# Patient Record
Sex: Male | Born: 1960
Health system: Southern US, Community
[De-identification: ages and names within clinical notes are randomized; demographics above are authoritative.]

## PROBLEM LIST (undated history)

## (undated) DIAGNOSIS — M1990 Unspecified osteoarthritis, unspecified site: Secondary | ICD-10-CM

## (undated) DIAGNOSIS — E119 Type 2 diabetes mellitus without complications: Secondary | ICD-10-CM

## (undated) DIAGNOSIS — I10 Essential (primary) hypertension: Secondary | ICD-10-CM

## (undated) DIAGNOSIS — Z87442 Personal history of urinary calculi: Secondary | ICD-10-CM

## (undated) HISTORY — PX: KNEE ARTHROSCOPY: SUR90

## (undated) HISTORY — PX: APPENDECTOMY: SHX54

## (undated) HISTORY — PX: BACK SURGERY: SHX140

## (undated) HISTORY — PX: FINGER SURGERY: SHX640

---

## 2008-05-28 ENCOUNTER — Emergency Department (HOSPITAL_COMMUNITY): Admission: EM | Admit: 2008-05-28 | Discharge: 2008-05-28 | Payer: Self-pay | Admitting: Emergency Medicine

## 2008-05-29 ENCOUNTER — Ambulatory Visit: Payer: Self-pay | Admitting: Orthopedic Surgery

## 2008-05-29 DIAGNOSIS — S61409A Unspecified open wound of unspecified hand, initial encounter: Secondary | ICD-10-CM | POA: Insufficient documentation

## 2008-05-29 DIAGNOSIS — S61209A Unspecified open wound of unspecified finger without damage to nail, initial encounter: Secondary | ICD-10-CM | POA: Insufficient documentation

## 2008-06-02 ENCOUNTER — Ambulatory Visit: Payer: Self-pay | Admitting: Orthopedic Surgery

## 2008-06-04 ENCOUNTER — Encounter: Payer: Self-pay | Admitting: Orthopedic Surgery

## 2008-11-10 ENCOUNTER — Ambulatory Visit (HOSPITAL_COMMUNITY): Admission: RE | Admit: 2008-11-10 | Discharge: 2008-11-10 | Payer: Self-pay | Admitting: Family Medicine

## 2009-07-15 ENCOUNTER — Emergency Department (HOSPITAL_COMMUNITY): Admission: EM | Admit: 2009-07-15 | Discharge: 2009-07-15 | Payer: Self-pay | Admitting: Emergency Medicine

## 2009-10-22 ENCOUNTER — Ambulatory Visit (HOSPITAL_COMMUNITY): Admission: RE | Admit: 2009-10-22 | Discharge: 2009-10-23 | Payer: Self-pay | Admitting: Neurological Surgery

## 2010-03-19 ENCOUNTER — Other Ambulatory Visit (HOSPITAL_COMMUNITY): Payer: Self-pay | Admitting: Family Medicine

## 2010-03-24 ENCOUNTER — Ambulatory Visit (HOSPITAL_COMMUNITY)
Admission: RE | Admit: 2010-03-24 | Discharge: 2010-03-24 | Disposition: A | Discharge status: Home / Self Care | Payer: PRIVATE HEALTH INSURANCE | Source: Ambulatory Visit | Attending: Family Medicine | Admitting: Family Medicine

## 2010-03-24 DIAGNOSIS — N1330 Unspecified hydronephrosis: Secondary | ICD-10-CM | POA: Insufficient documentation

## 2010-03-24 DIAGNOSIS — R319 Hematuria, unspecified: Secondary | ICD-10-CM | POA: Insufficient documentation

## 2010-04-29 ENCOUNTER — Ambulatory Visit (HOSPITAL_COMMUNITY)
Admission: RE | Admit: 2010-04-29 | Discharge: 2010-04-29 | Disposition: A | Discharge status: Home / Self Care | Payer: PRIVATE HEALTH INSURANCE | Source: Ambulatory Visit | Attending: Urology | Admitting: Urology

## 2010-04-29 DIAGNOSIS — E119 Type 2 diabetes mellitus without complications: Secondary | ICD-10-CM | POA: Insufficient documentation

## 2010-04-29 DIAGNOSIS — N201 Calculus of ureter: Secondary | ICD-10-CM | POA: Insufficient documentation

## 2010-04-29 LAB — GLUCOSE, CAPILLARY

## 2010-05-07 LAB — SURGICAL PCR SCREEN
MRSA, PCR: NEGATIVE
Staphylococcus aureus: NEGATIVE

## 2010-05-07 LAB — PROTIME-INR: Prothrombin Time: 12 seconds (ref 11.6–15.2)

## 2010-05-07 LAB — CBC
HCT: 44.9 % (ref 39.0–52.0)
MCH: 30.6 pg (ref 26.0–34.0)
MCHC: 34.5 g/dL (ref 30.0–36.0)
RBC: 5.07 MIL/uL (ref 4.22–5.81)

## 2010-05-07 LAB — DIFFERENTIAL
Eosinophils Absolute: 0.4 10*3/uL (ref 0.0–0.7)
Eosinophils Relative: 4 % (ref 0–5)
Lymphocytes Relative: 25 % (ref 12–46)
Lymphs Abs: 2.3 10*3/uL (ref 0.7–4.0)
Monocytes Absolute: 0.8 10*3/uL (ref 0.1–1.0)
Monocytes Relative: 9 % (ref 3–12)

## 2010-05-07 LAB — BASIC METABOLIC PANEL
BUN: 12 mg/dL (ref 6–23)
Calcium: 9.3 mg/dL (ref 8.4–10.5)
Glucose, Bld: 172 mg/dL — ABNORMAL HIGH (ref 70–99)
Sodium: 136 mEq/L (ref 135–145)

## 2010-05-07 LAB — APTT: aPTT: 32 seconds (ref 24–37)

## 2010-05-10 LAB — URINALYSIS, ROUTINE W REFLEX MICROSCOPIC
Bilirubin Urine: NEGATIVE
Glucose, UA: NEGATIVE mg/dL
Ketones, ur: NEGATIVE mg/dL
Leukocytes, UA: NEGATIVE
Protein, ur: NEGATIVE mg/dL
Urobilinogen, UA: 0.2 mg/dL (ref 0.0–1.0)
pH: 7 (ref 5.0–8.0)

## 2010-05-10 LAB — URINE MICROSCOPIC-ADD ON

## 2011-09-02 ENCOUNTER — Other Ambulatory Visit (HOSPITAL_COMMUNITY): Payer: Self-pay | Admitting: Family Medicine

## 2011-09-02 ENCOUNTER — Ambulatory Visit (HOSPITAL_COMMUNITY)
Admission: RE | Admit: 2011-09-02 | Discharge: 2011-09-02 | Disposition: A | Discharge status: Home / Self Care | Payer: PRIVATE HEALTH INSURANCE | Source: Ambulatory Visit | Attending: Family Medicine | Admitting: Family Medicine

## 2011-09-02 DIAGNOSIS — M25562 Pain in left knee: Secondary | ICD-10-CM

## 2011-09-02 DIAGNOSIS — M25569 Pain in unspecified knee: Secondary | ICD-10-CM | POA: Insufficient documentation

## 2011-09-22 ENCOUNTER — Ambulatory Visit: Payer: PRIVATE HEALTH INSURANCE | Admitting: Orthopedic Surgery

## 2011-09-26 ENCOUNTER — Other Ambulatory Visit: Payer: Self-pay | Admitting: Specialist

## 2011-09-26 ENCOUNTER — Telehealth: Payer: Self-pay | Admitting: Orthopedic Surgery

## 2011-09-26 DIAGNOSIS — M1710 Unilateral primary osteoarthritis, unspecified knee: Secondary | ICD-10-CM

## 2011-09-26 DIAGNOSIS — IMO0002 Reserved for concepts with insufficient information to code with codable children: Secondary | ICD-10-CM

## 2011-09-26 NOTE — Telephone Encounter (Signed)
Patient had been given first available appointment following Dr. Mort Sawyers return to office*09/22/11.   Called pt on 09/21/11 - relayed Dr Romeo Apple had emergency and was unable to return for this 09/22/11 appointment.  We advised we were awaiting further direction from Dr. Romeo Apple and would call back to re-schedule as soon as possible..  09/23/11 called pt to offer new appt, lft msg on mach, offered for Tuesday 09/27/11.  09/26/11 Called back to patient to offer appointment for tomorrow (09/27/11), as no response to call on 09/23/11. Patient states was hurting and has already contacted, and been seen, by ortho at [facility].  He thanked [place] for following up.

## 2011-09-29 ENCOUNTER — Ambulatory Visit (HOSPITAL_COMMUNITY)
Admission: RE | Admit: 2011-09-29 | Discharge: 2011-09-29 | Disposition: A | Discharge status: Home / Self Care | Payer: BC Managed Care – PPO | Source: Ambulatory Visit | Attending: Specialist | Admitting: Specialist

## 2011-09-29 ENCOUNTER — Other Ambulatory Visit (HOSPITAL_COMMUNITY): Payer: Self-pay | Admitting: Specialist

## 2011-09-29 ENCOUNTER — Ambulatory Visit
Admission: RE | Admit: 2011-09-29 | Discharge: 2011-09-29 | Disposition: A | Discharge status: Home / Self Care | Payer: BC Managed Care – PPO | Source: Ambulatory Visit | Attending: Specialist | Admitting: Specialist

## 2011-09-29 DIAGNOSIS — IMO0002 Reserved for concepts with insufficient information to code with codable children: Secondary | ICD-10-CM

## 2011-09-29 DIAGNOSIS — M1710 Unilateral primary osteoarthritis, unspecified knee: Secondary | ICD-10-CM

## 2011-09-29 DIAGNOSIS — Z01818 Encounter for other preprocedural examination: Secondary | ICD-10-CM

## 2011-09-29 DIAGNOSIS — Z0389 Encounter for observation for other suspected diseases and conditions ruled out: Secondary | ICD-10-CM | POA: Insufficient documentation

## 2011-10-28 ENCOUNTER — Ambulatory Visit (HOSPITAL_COMMUNITY)
Admission: RE | Admit: 2011-10-28 | Discharge: 2011-10-28 | Disposition: A | Discharge status: Home / Self Care | Payer: BC Managed Care – PPO | Source: Ambulatory Visit | Attending: Specialist | Admitting: Specialist

## 2011-10-28 DIAGNOSIS — IMO0001 Reserved for inherently not codable concepts without codable children: Secondary | ICD-10-CM | POA: Insufficient documentation

## 2011-10-28 DIAGNOSIS — R262 Difficulty in walking, not elsewhere classified: Secondary | ICD-10-CM | POA: Insufficient documentation

## 2011-10-28 DIAGNOSIS — M25569 Pain in unspecified knee: Secondary | ICD-10-CM | POA: Insufficient documentation

## 2011-10-28 NOTE — Evaluation (Signed)
Physical Therapy Evaluation  Patient Details  Name: Alex Taylor MRN: [id number] Date of Birth: 23-Sep-1960  Today's Date: 10/28/2011 Time:  -     Visit#: 1  of 4   Re-eval: 11/11/11 Assessment Diagnosis: L arthroscopic surgery Surgical Date: 10/20/11 Next MD Visit:  (3 weeks)  Authorization: BCBS   Past Medical History: No past medical history on file. Past Surgical History: No past surgical history on file.  Subjective Symptoms/Limitations Symptoms: Mr. Edenfield states that he had arthroscopic surgery performed on his left knee on 10/20/11.  He has been referred to therapy to progress him to prior function.  Mr. Nelis states that his knee had been bothering him for about 6-8 months prior to the surgery.  Currently the pain is mainly on the anterior aspect and is more soreness than anything. How long can you sit comfortably?: The patient states he can sit for 30-40 minutes at a time. How long can you stand comfortably?: The patient states he is able to stand for an hour How long can you walk comfortably?: The patient has an antalgic gait.  The most he has walked 20-30 minutes without stopping without difficulty. Pain Assessment Currently in Pain?: Yes Pain Score: 0-No pain (mainly soreness) Pain Relieving Factors: icing; elevating Effect of Pain on Daily Activities: Pt has not really been doing much.    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  Sensation/Coordination/Flexibility/Functional Tests Functional Tests Functional Tests: 52/80  Assessment LLE AROM (degrees) Left Knee Extension: 3  Left Knee Flexion: 120  LLE Strength Left Hip Flexion: 5/5 Left Hip Extension: 5/5 Left Hip ABduction: 5/5 Left Hip ADduction: 5/5 Left Knee Flexion: 5/5 Left Knee Extension: 5/5 Left Ankle  Dorsiflexion: 5/5  Exercise/Treatments Mobility/Balance  Ambulation/Gait Ambulation/Gait:  (Pt exhibits and antalgic gait with decrease WB on L LE) Static Standing Balance Single Leg Stance - Right Leg: 24  Single Leg Stance - Left Leg: 30    Stretches Active Hamstring Stretch: 3 reps;30 seconds   Standing Heel Raises: 15 reps Forward Lunges: Left;5 reps Functional Squat: 10 reps     Physical Therapy Assessment and Plan PT Assessment and Plan Clinical Impression Statement: Pt with good mm test and ROM.  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      Goals Home Exercise Program Pt will Perform Home Exercise Program: Independently PT Short Term Goals Time to Complete Short Term Goals: 2 weeks PT Short Term Goal 1: normalized gt PT Short Term Goal 2: Pt to be comfortable squatting down to pick up object PT Short Term Goal 3: pt to be able to go up and down steps without hand rail PT Short Term Goal 4: no pain  Problem List Patient Active Problem List  Diagnosis  . LACERATION, HAND, RIGHT  . OPEN WOUND OF FINGER WITH TENDON INVOLVEMENT  . Difficulty in walking    PT Plan of Care PT Home Exercise Plan: given  Consulted and Agree with Plan of Care: Patient   RUSSELL,CINDY 10/28/2011, 3:52 PM  Physician Documentation Your signature is required to indicate approval of the treatment plan as stated above.  Please sign and either send  electronically or make a copy of this report for your files and return this physician signed original.   Please mark one 1.__approve of plan  2. ___approve of plan with the following conditions.   ______________________________                                                          _____________________ Physician Signature                                                                                                             Date

## 2011-11-02 ENCOUNTER — Ambulatory Visit (HOSPITAL_COMMUNITY)
Admission: RE | Admit: 2011-11-02 | Discharge: 2011-11-02 | Disposition: A | Discharge status: Home / Self Care | Payer: BC Managed Care – PPO | Source: Ambulatory Visit | Attending: Family Medicine | Admitting: Family Medicine

## 2011-11-02 DIAGNOSIS — R262 Difficulty in walking, not elsewhere classified: Secondary | ICD-10-CM

## 2011-11-02 NOTE — Progress Notes (Signed)
Physical Therapy Treatment Patient Details  Name: Alex Taylor MRN: [id number] Date of Birth: [birth year]  Today's Date: 11/02/2011 Time: 0802-0844 PT Time Calculation (min): 42 min  Visit#: 2  of 4   Re-eval: 11/10/11    Authorization:    Authorization Time Period:    Authorization Visit#:   of     Subjective: Symptoms/Limitations Symptoms: Alex Taylor states he hit his knee this weekend and it remains sore.  States that he feels this is the reason that he is limping.    Exercise/Treatments   Stretches Active Hamstring Stretch: 3 reps;30 seconds Passive Hamstring Stretch: 1 rep;60 seconds Gastroc Stretch: 3 reps;30 seconds Aerobic Tread Mill: 1.7 x 6'   Standing Heel Raises: 15 reps Forward Lunges: 10 reps Terminal Knee Extension: 10 reps Lateral Step Up: 10 reps Forward Step Up: 10 reps Step Down: 10 reps Functional Squat: 15 reps Stairs: 1 rt Rocker Board: 1 minute SLS: 3 x 60sec. Rebounder:  (1 min R/L)     Physical Therapy Assessment and Plan PT Assessment and Plan Clinical Impression Statement: Pt exhibited good control with all new exercises today; some fatigue noted with step downs.   Rehab Potential: Good PT Plan: Pt to begin rebounder, gait training on TM if pt still has limp and lifting next treatment.      Goals    Problem List Patient Active Problem List  Diagnosis  . LACERATION, HAND, RIGHT  . OPEN WOUND OF FINGER WITH TENDON INVOLVEMENT  . Difficulty in walking    PT - End of Session Activity Tolerance: Patient tolerated treatment well General Behavior During Session: [facility] for tasks performed Cognition: [facility] for tasks performed PT Plan of Care PT Home Exercise Plan: update given Consulted and Agree with Plan of Care: Patient  GP    AlexCINDY 11/02/2011, 8:46 AM

## 2011-11-03 ENCOUNTER — Inpatient Hospital Stay (HOSPITAL_COMMUNITY)
Admission: RE | Admit: 2011-11-03 | Payer: BC Managed Care – PPO | Source: Ambulatory Visit | Admitting: Physical Therapy

## 2011-11-08 ENCOUNTER — Ambulatory Visit (HOSPITAL_COMMUNITY)
Admission: RE | Admit: 2011-11-08 | Discharge: 2011-11-08 | Disposition: A | Discharge status: Home / Self Care | Payer: BC Managed Care – PPO | Source: Ambulatory Visit | Attending: Family Medicine | Admitting: Family Medicine

## 2011-11-08 DIAGNOSIS — R262 Difficulty in walking, not elsewhere classified: Secondary | ICD-10-CM

## 2011-11-08 NOTE — Progress Notes (Signed)
Physical Therapy Treatment Patient Details  Name: Alex Taylor MRN: [id number] Date of Birth: [birth year]  Today's Date: 11/08/2011 Time: 0800-0846 PT Time Calculation (min): 46 min  Visit#: 3  of 4   Re-eval: 11/10/11  Charge: therex 38 Gait 8   Authorization: BCBS   Subjective: Symptoms/Limitations Symptoms: L knee is a little tender/sore today, intermittent pain.  Objective:   Exercise/Treatments Stretches Active Hamstring Stretch: 3 reps;30 seconds Gastroc Stretch: 3 reps;30 seconds Aerobic Tread Mill: 2.0 x 8' Standing Forward Lunges: 10 reps Terminal Knee Extension: 15 reps;Theraband Theraband Level (Terminal Knee Extension): Level 4 (Blue) Lateral Step Up: 15 reps;Step Height: 6";Hand Hold: 0 Forward Step Up: 15 reps;Hand Hold: 0;Step Height: 6" Step Down: 15 reps;Hand Hold: 0;Step Height: 6" Functional Squat: 15 reps;Limitations Functional Squat Limitations: proper lifting orange ball off 6 in step Rocker Board: 1 minute;Limitations Rocker Board Limitations: R/L Rebounder: static and dynamic surface R/L 15x each    Physical Therapy Assessment and Plan PT Assessment and Plan Clinical Impression Statement: Added rebounder for balance with cueing for spatial awareness on static and dynamic surfaces, pt required min assistance with LOB episodes with new exercise. Pt with good control though most therex, noted increased fatigue wtih step down no HHA. Pt wtih good gait mechanics noted with HHA on TM, attempted with no UE A with antalgic gait. PT Plan: Progress gait mechanics, improve balance and strength.    Goals    Problem List Patient Active Problem List  Diagnosis  . LACERATION, HAND, RIGHT  . OPEN WOUND OF FINGER WITH TENDON INVOLVEMENT  . Difficulty in walking    PT - End of Session Activity Tolerance: Patient tolerated treatment well General Behavior During Session: [facility] for tasks performed Cognition: [facility] for tasks performed  GP    Juel Burrow 11/08/2011, 12:50 PM

## 2011-11-10 ENCOUNTER — Ambulatory Visit (HOSPITAL_COMMUNITY): Payer: BC Managed Care – PPO | Admitting: Physical Therapy

## 2011-11-15 ENCOUNTER — Ambulatory Visit (HOSPITAL_COMMUNITY)
Admission: RE | Admit: 2011-11-15 | Discharge: 2011-11-15 | Disposition: A | Discharge status: Home / Self Care | Payer: BC Managed Care – PPO | Source: Ambulatory Visit | Attending: Family Medicine | Admitting: Family Medicine

## 2011-11-15 NOTE — Progress Notes (Signed)
Physical Therapy Treatment Patient Details  Name: Alex Taylor MRN: [id number] Date of Birth: 07/25/1960  Today's Date: 11/15/2011 Time: 4696-2952 PT Time Calculation (min): 38 min Charges:  therex 38' Visit#: 4  of 4   Re-eval: 11/17/11 Assessment Diagnosis: L arthroscopic surgery Surgical Date: 10/20/11 Next MD Visit: 11/18/11  ASubjective: Symptoms/Limitations Symptoms: Pt. states he gets twinges of pain every now and then.  Currently 1-2/10 when having twinges. Pain Assessment Currently in Pain?: Yes Pain Score:   2 Pain Location: Knee Pain Orientation: Left   Exercise/Treatments Stretches Gastroc Stretch: 3 reps;30 seconds;Limitations Gastroc Stretch Limitations: slant board Aerobic Tread Mill: 2.0 x 8' Standing Forward Lunges: 15 reps Terminal Knee Extension: 15 reps;Theraband Theraband Level (Terminal Knee Extension): Level 4 (Blue) Lateral Step Up: 15 reps;Step Height: 6";Hand Hold: 0 Forward Step Up: 15 reps;Hand Hold: 0;Step Height: 6" Step Down: 15 reps;Hand Hold: 0;Step Height: 6" Functional Squat: 15 reps;Limitations Functional Squat Limitations: proper lifting orange ball off 6 in step Rocker Board: 2 minutes Rocker Board Limitations: R/L Rebounder: static and dynamic surface R/L 15x each     Physical Therapy Assessment and Plan PT Assessment and Plan Clinical Impression Statement: Pt. continues to require step down to complete rebounder B LE's due to weakness/instability.    Less fatigue/weakness noted with eccentric strength As noted with forward step downs.  Less antalgia with gait. PT Plan: Re-eval next visit.     Problem List Patient Active Problem List  Diagnosis  . LACERATION, HAND, RIGHT  . OPEN WOUND OF FINGER WITH TENDON INVOLVEMENT  . Difficulty in walking    PT - End of Session Activity Tolerance: Patient tolerated treatment well General Behavior During Session: [facility] for tasks performed Cognition: [facility] for tasks performed   Lurena Nida, PTA/CLT 11/15/2011, 9:01 AM

## 2011-11-17 ENCOUNTER — Ambulatory Visit (HOSPITAL_COMMUNITY)
Admission: RE | Admit: 2011-11-17 | Discharge: 2011-11-17 | Disposition: A | Discharge status: Home / Self Care | Payer: BC Managed Care – PPO | Source: Ambulatory Visit | Attending: Family Medicine | Admitting: Family Medicine

## 2011-11-17 NOTE — Evaluation (Addendum)
Physical Therapy Evaluation  Patient Details  Name: Alex Taylor MRN: [id number] Date of Birth: 1960/05/21  Today's Date: 11/17/2011 Time: 3086-5784 PT Time Calculation (min): 28 min  Visit#: 5  of 5   Re-eval: 11/17/11 Charges: Gait x 10' ROMM x 1    Past Medical History: No past medical history on file. Past Surgical History: No past surgical history on file.  Subjective Symptoms/Limitations Symptoms: Pt states that his pain comes and goes. He is not currently having any pain. Pain Assessment Currently in Pain?: No/denies   Sensation/Coordination/Flexibility/Functional Tests Functional Tests Functional Tests: 72//80 (was 52/70)  Assessment LLE AROM (degrees) Left Knee Extension: 0  (was 3) Left Knee Flexion: 125  (was 120) LLE Strength Left Hip Flexion: 5/5 Left Hip Extension: 5/5 Left Hip ABduction: 5/5 Left Hip ADduction: 5/5 Left Knee Flexion: 5/5 Left Knee Extension: 5/5 Left Ankle Dorsiflexion: 5/5  Exercise/Treatments Mobility/Balance  Static Standing Balance Single Leg Stance - Left Leg: 60  (was 30)  Aerobic Tread Mill: 2.0 x 8' (with cueing for posture) Standing Stairs: 1RT reciprocal no HR  Physical Therapy Assessment and Plan PT Assessment and Plan Clinical Impression Statement: Pt displays improved LLE ROM and stability. Pt's LEFS has improved 20 points since initial eval. Pt has met all goals. Pt reports that he is no longer limited by his knee. Pt states that he is comfortable with D/C to HEP.  PT Plan: Recommend D/C to HEP.    Goals Home Exercise Program Pt will Perform Home Exercise Program: Independently PT Short Term Goals Time to Complete Short Term Goals: 2 weeks PT Short Term Goal 1: normalized gt PT Short Term Goal 1 - Progress: Met PT Short Term Goal 2: Pt to be comfortable squatting down to pick up object PT Short Term Goal 2 - Progress: Met PT Short Term Goal 3: pt to be able to go up and down steps without hand rail PT Short  Term Goal 3 - Progress: Met PT Short Term Goal 4: no pain PT Short Term Goal 4 - Progress: Partly met (Pt states that pain is intermittent. )  Problem List Patient Active Problem List  Diagnosis  . LACERATION, HAND, RIGHT  . OPEN WOUND OF FINGER WITH TENDON INVOLVEMENT  . Difficulty in walking    PT - End of Session Activity Tolerance: Patient tolerated treatment well General Behavior During Session: [facility] for tasks performed Cognition: [facility] for tasks performed  Seth Bake, PTA 11/17/2011, 8:47 AM  Physician Documentation Your signature is required to indicate approval of the treatment plan as stated above.  Please sign and either send electronically or make a copy of this report for your files and return this physician signed original.   Please mark one 1.__approve of plan  2. ___approve of plan with the following conditions.   ______________________________                                                          _____________________ Physician Signature  Date  

## 2011-12-15 ENCOUNTER — Encounter (HOSPITAL_COMMUNITY): Payer: Self-pay

## 2011-12-15 NOTE — H&P (Signed)
  NTS SOAP Note  Vital Signs:  Vitals as of: 12/15/2011: Systolic 166: Diastolic 101: Heart Rate 85: Temp 96.55F: Height 29ft 7in: Weight 251Lbs 0 Ounces: BMI 39  BMI : 39.31 kg/m2  Subjective: This 52 Years 42 Months old Male presents for screening TCS.  Never has had a colonoscopy.  Denies any significant lower gi complaints.  No family h/o colon cancer.   Review of Symptoms:  Constitutional:unremarkable   Head:unremarkable    Eyes:unremarkable   Nose/Mouth/Throat:unremarkable Cardiovascular:  unremarkable   Respiratory:unremarkable   Gastrointestin    heartburn Genitourinary:    frequency Musculoskeletal:unremarkable   Skin:unremarkable Hematolgic/Lymphatic:unremarkable     Allergic/Immunologic:unremarkable     Past Medical History:    Reviewed   Past Medical History  Surgical History: back surgery, knee surgery Medical Problems:  Diabetes, High cholesterol Allergies: pcn, codeine Medications: glucosamine, ibuprofen, lovastatin, metformin, MVI, baby asa   Social History:Reviewed  Social History  Preferred Language: English Ethnicity: Not Hispanic / Latino Age: 87 Years 8 Months Marital Status:  M Alcohol: yes, 12 pack a week Recreational drug(s):  No   Smoking Status: Never smoker reviewed on 12/15/2011 Functional Status reviewed on mm/dd/yyyy ------------------------------------------------ Bathing: Normal Cooking: Normal Dressing: Normal Driving: Normal Eating: Normal Managing Meds: Normal Oral Care: Normal Shopping: Normal Toileting: Normal Transferring: Normal Walking: Normal Cognitive Status reviewed on mm/dd/yyyy ------------------------------------------------ Attention: Normal Decision Making: Normal Language: Normal Memory: Normal Motor: Normal Perception: Normal Problem Solving: Normal Visual and Spatial: Normal   Family History:  Reviewed   Family History  Is there a family history  of:No family h/o colon cancer    Objective Information: General:  Well appearing, well nourished in no distress. Neck:  Supple without lymphadenopathy.  Heart:  RRR, no murmur Lungs:    CTA bilaterally, no wheezes, rhonchi, rales.  Breathing unlabored. Abdomen:Soft, NT/ND, no HSM, no masses.   deferred to procedure  Assessment:Need for screening TCS  Diagnosis &amp; Procedure: DiagnosisCode: V76.51, ProcedureCode: 16109,    Plan:Scheduled for TCS on 12/20/11.   Patient Education:Alternative treatments to surgery were discussed with patient (and family).  Risks and benefits  of procedure were fully explained to the patient (and family) who gave informed consent. Patient/family questions were addressed.  Follow-up:Pending Surgery

## 2011-12-15 NOTE — H&P (Deleted)
  NTS SOAP Note  Vital Signs:  Vitals as of: 12/15/2011: Systolic 152: Diastolic 91: Heart Rate 70: Temp 98.16F: Height 45ft 2in: Weight 130Lbs 0 Ounces: BMI 24  BMI : 23.78 kg/m2  Subjective: This 51 Years 68 Months old Male presents for portacath insertion.  Is about to undergo chemotherapy for metastatic cancer, unknown primary.   Review of Symptoms:  Constitutional:unremarkable   Head:unremarkable    Eyes:unremarkable   Nose/Mouth/Throat:unremarkable Cardiovascular:  unremarkable   Respiratory:unremarkable   Gastrointestinal:  unremarkable   Genitourinary:unremarkable       joint pain Skin:unremarkable Hematolgic/Lymphatic:unremarkable     Allergic/Immunologic:unremarkable     Past Medical History:    Reviewed   Past Medical History  Surgical History: mastectomy right, liver surgery, bladder tackup, hysterectomy Medical Problems: metastatic cancer unknown primary, HTN, DVT Allergies: nkda Medications: coumadin, HCTZ, metoprolol, allopurinol, kcl, lisinopril, fish oil, MVI   Social History:Reviewed  Social History  Preferred Language: English Race:  Black or African American Ethnicity: Not Hispanic / Latino Age: 51 Years 8 Months Marital Status:  M Alcohol:  No Recreational drug(s):  No   Smoking Status: Never smoker reviewed on 12/15/2011 Functional Status reviewed on mm/dd/yyyy ------------------------------------------------ Bathing: Normal Cooking: Normal Dressing: Normal Driving: Normal Eating: Normal Managing Meds: Normal Oral Care: Normal Shopping: Normal Toileting: Normal Transferring: Normal Walking: Normal Cognitive Status reviewed on mm/dd/yyyy ------------------------------------------------ Attention: Normal Decision Making: Normal Language: Normal Memory: Normal Motor: Normal Perception: Normal Problem Solving: Normal Visual and Spatial: Normal   Family History:  Reviewed   Family  History              Mother:  Diabetes Type II, Coronary Artery Disease             Sibling:  Cancer, Diabetes Type II    Objective Information: General:  Well appearing, well nourished in no distress. Heart:  RRR, no murmur Lungs:    CTA bilaterally, no wheezes, rhonchi, rales.  Breathing unlabored.  Assessment:Need for central venous access for chemotherapy  Diagnosis &amp; Procedure: DiagnosisCode: 199.0, ProcedureCode: 32440,    Plan:Scheduled for portacath insertion on 12/23/11.   Patient Education:Alternative treatments to surgery were discussed with patient (and family).  Risks and benefits  of procedure including possibility of collapse lung were fully explained to the patient (and family) who gave informed consent. Patient/family questions were addressed.  Will stop coumadin five days before procedure.  Follow-up:Pending Surgery

## 2011-12-19 MED ORDER — SODIUM CHLORIDE 0.45 % IV SOLN
INTRAVENOUS | Status: DC
  Administered 2011-12-20: 09:00:00 via INTRAVENOUS

## 2011-12-20 ENCOUNTER — Ambulatory Visit (HOSPITAL_COMMUNITY)
Admission: RE | Admit: 2011-12-20 | Discharge: 2011-12-20 | Disposition: A | Discharge status: Home / Self Care | Payer: BC Managed Care – PPO | Source: Ambulatory Visit | Attending: General Surgery | Admitting: General Surgery

## 2011-12-20 ENCOUNTER — Encounter (HOSPITAL_COMMUNITY)
Admission: RE | Disposition: A | Discharge status: Home / Self Care | Payer: Self-pay | Source: Ambulatory Visit | Attending: General Surgery

## 2011-12-20 ENCOUNTER — Encounter (HOSPITAL_COMMUNITY): Payer: Self-pay | Admitting: *Deleted

## 2011-12-20 DIAGNOSIS — E119 Type 2 diabetes mellitus without complications: Secondary | ICD-10-CM | POA: Insufficient documentation

## 2011-12-20 DIAGNOSIS — D126 Benign neoplasm of colon, unspecified: Secondary | ICD-10-CM | POA: Insufficient documentation

## 2011-12-20 DIAGNOSIS — Z01812 Encounter for preprocedural laboratory examination: Secondary | ICD-10-CM | POA: Insufficient documentation

## 2011-12-20 DIAGNOSIS — Z1211 Encounter for screening for malignant neoplasm of colon: Secondary | ICD-10-CM | POA: Insufficient documentation

## 2011-12-20 HISTORY — DX: Type 2 diabetes mellitus without complications: E11.9

## 2011-12-20 HISTORY — PX: COLONOSCOPY: SHX5424

## 2011-12-20 LAB — GLUCOSE, CAPILLARY: Glucose-Capillary: 186 mg/dL — ABNORMAL HIGH (ref 70–99)

## 2011-12-20 SURGERY — COLONOSCOPY
Anesthesia: Moderate Sedation

## 2011-12-20 MED ORDER — MEPERIDINE HCL 25 MG/ML IJ SOLN
INTRAMUSCULAR | Status: DC | PRN
  Administered 2011-12-20: 50 mg via INTRAVENOUS

## 2011-12-20 MED ORDER — MIDAZOLAM HCL 5 MG/5ML IJ SOLN
INTRAMUSCULAR | Status: AC
  Filled 2011-12-20: qty 10

## 2011-12-20 MED ORDER — MEPERIDINE HCL 50 MG/ML IJ SOLN
INTRAMUSCULAR | Status: AC
  Filled 2011-12-20: qty 1

## 2011-12-20 MED ORDER — MIDAZOLAM HCL 5 MG/5ML IJ SOLN
INTRAMUSCULAR | Status: DC | PRN
  Administered 2011-12-20: 4 mg via INTRAVENOUS
  Administered 2011-12-20: 1 mg via INTRAVENOUS

## 2011-12-20 MED ORDER — STERILE WATER FOR IRRIGATION IR SOLN
Status: DC | PRN
  Administered 2011-12-20: 09:00:00

## 2011-12-20 NOTE — Interval H&P Note (Signed)
History and Physical Interval Note:  12/20/2011 9:07 AM  Alex Taylor  has presented today for surgery, with the diagnosis of screening  The various methods of treatment have been discussed with the patient and family. After consideration of risks, benefits and other options for treatment, the patient has consented to  Procedure(s) (LRB) with comments: COLONOSCOPY (N/A) as a surgical intervention .  The patient's history has been reviewed, patient examined, no change in status, stable for surgery.  I have reviewed the patient's chart and labs.  Questions were answered to the patient's satisfaction.     Franky Macho A

## 2011-12-20 NOTE — Op Note (Signed)
[facility] [address] [place], [address]   COLONOSCOPY PROCEDURE REPORT  PATIENT: Alex Taylor, Alex Taylor  MR#: [id number] BIRTHDATE: 1960-11-20 , 51  yrs. old GENDER: Male ENDOSCOPIST: Franky Macho, MD REFERRED NW:GNFAOZHY, Brett Canales PROCEDURE DATE:  12/20/2011 PROCEDURE:   Colonoscopy, screening ASA CLASS:   Class I INDICATIONS:average risk patient for colon cancer. MEDICATIONS: Versed 5 mg IV and Demerol 50 mg IV  DESCRIPTION OF PROCEDURE:   After the risks benefits and alternatives of the procedure were thoroughly explained, informed consent was obtained.  A digital rectal exam revealed no abnormalities of the rectum.   The     endoscope was introduced through the anus and advanced to the cecum, which was identified by both the appendix and ileocecal valve. No adverse events experienced.   The quality of the prep was adequate, using MoviPrep The instrument was then slowly withdrawn as the colon was fully examined.      COLON FINDINGS: Three semi-pedunculated polyps ranging between 3-12mm in size were found at the cecum and in the sigmoid colon, 30cm and 20cm from the anus. Images are in order of withdrawal from the cecum.  A polypectomy was performed using snare cautery.  The resection was complete and the polyp tissue was completely retrieved.  Retroflexed views revealed no abnormalities. The time to cecum=9 minutes 0 seconds.  Withdrawal time=17 minutes 0 seconds.  The scope was withdrawn and the procedure completed. COMPLICATIONS: There were no complications.  ENDOSCOPIC IMPRESSION: Three semi-pedunculated polyps ranging between 3-83mm in size were found at the cecum and in the sigmoid colon; polypectomy was performed using snare cautery  RECOMMENDATIONS: 1.  Await pathology results 2.  Repeat Colonoscopy in 3 years.   eSigned:  Franky Macho, MD 12/20/2011 9:50 AM   cc:   PATIENT NAME:  [name], [name] MR#: [id number]

## 2011-12-22 ENCOUNTER — Encounter (HOSPITAL_COMMUNITY): Payer: Self-pay | Admitting: General Surgery

## 2012-03-19 ENCOUNTER — Ambulatory Visit (INDEPENDENT_AMBULATORY_CARE_PROVIDER_SITE_OTHER): Payer: BC Managed Care – PPO | Admitting: Endocrinology

## 2012-03-19 ENCOUNTER — Telehealth: Payer: Self-pay

## 2012-03-19 VITALS — BP 126/80 | HR 100 | Wt 204.0 lb

## 2012-03-19 DIAGNOSIS — E119 Type 2 diabetes mellitus without complications: Secondary | ICD-10-CM

## 2012-03-19 MED ORDER — BROMOCRIPTINE MESYLATE 2.5 MG PO TABS: 2.5000 mg | ORAL_TABLET | Freq: Every day | ORAL | Status: DC

## 2012-03-19 NOTE — Telephone Encounter (Signed)
Please ask pt to go to a different pharmacy.  Let me know which one

## 2012-03-19 NOTE — Patient Instructions (Addendum)
good diet and exercise habits significanly improve the control of your diabetes.  please let me know if you wish to be referred to a dietician, or for weight-loss surgery.  high blood sugar is very risky to your health.  you should see an eye doctor every year.  You are at higher than average risk for pneumonia and hepatitis-B.  You should be vaccinated against both.   controlling your blood pressure and cholesterol drastically reduces the damage diabetes does to your body.  this also applies to quitting smoking.  please discuss these with your doctor.  you should take an aspirin every day, unless you have been advised by a doctor not to. check your blood sugar once a day.  vary the time of day when you check, between before the 3 meals, and at bedtime.  also check if you have symptoms of your blood sugar being too high or too low.  please keep a record of the readings and bring it to your next appointment here.  please call us sooner if your blood sugar goes below 70, or if you have a lot of readings over 200.  i have sent a prescription to your pharmacy:  please add-on "bromocriptine," to help your blood sugar. It has possible side effects of nausea and dizziness.  These go away with time.  You can avoid these by taking it at bedtime, and by taking just take 1/2 pill for the first week.   Please call if this doesn't help, so i can add-on another pill called "januvia."

## 2012-03-19 NOTE — Progress Notes (Signed)
Subjective:    Patient ID: Alex Taylor, male    DOB: Sep 05, 1960, 52 y.o.   MRN: [id number]  HPI pt states 2 years h/o dm; no chronic complications.  he has never been on insulin.  pt says his diet and exercise are much better recently.  He says cbg's have recently increased, so they vary from 130-260. Pt states few mos of intermittent moderate cramps in the legs, and assoc wt loss (8 lbs).  Past Medical History  Diagnosis Date  . Diabetes mellitus without complication     Past Surgical History  Procedure Date  . Back surgery   . Knee arthroscopy     left knee  . Colonoscopy 12/20/2011    Procedure: COLONOSCOPY;  Surgeon: Dalia Heading, MD;  [facility];  Service: Gastroenterology;  Laterality: N/A;    History   Social History  . Marital Status: Married    Spouse Name: N/A    Number of Children: N/A  . Years of Education: N/A   Occupational History  . Not on file.   Social History Main Topics  . Smoking status: Not on file  . Smokeless tobacco: Never Used  . Alcohol Use: Yes     Comment: occ  . Drug Use: No  . Sexually Active:    Other Topics Concern  . Not on file   Social History Narrative  . No narrative on file    Current Outpatient Prescriptions on File Prior to Visit  Medication Sig Dispense Refill  . ibuprofen (ADVIL,MOTRIN) 200 MG tablet Take 200 mg by mouth every 6 (six) hours as needed. For pain      . lovastatin (MEVACOR) 10 MG tablet Take 10 mg by mouth at bedtime.      . metFORMIN (GLUCOPHAGE) 500 MG tablet Take 500 mg by mouth 2 (two) times daily with a meal.        Allergies  Allergen Reactions  . Codeine Other (See Comments)    "makes me go stupid" per patient  . Penicillins Other (See Comments)    Childhood reaction    No family history on file. DM: none in immediate family BP 126/80  Pulse 100  Wt 204 lb (92.534 kg)  SpO2 99%  Review of Systems denies blurry vision, headache, chest pain, sob, n/v, excessive  diaphoresis, memory loss, depression, hypoglycemia, rhinorrhea, and easy bruising.  He has urinary frequency.    Objective:   Physical Exam VS: see vs page GEN: no distress HEAD: head: no deformity eyes: no periorbital swelling, no proptosis external nose and ears are normal mouth: no lesion seen NECK: supple, thyroid is not enlarged CHEST WALL: no deformity LUNGS: clear to auscultation BREASTS:  No gynecomastia CV: reg rate and rhythm, no murmur ABD: abdomen is soft, nontender.  no hepatosplenomegaly.  not distended.  no hernia MUSCULOSKELETAL: muscle bulk and strength are grossly normal.  no obvious joint swelling.  gait is normal and steady EXTEMITIES: no deformity.  no ulcer on the feet.  feet are of normal color and temp.  Trace bilat leg edema PULSES: dorsalis pedis intact bilat.  no carotid bruit NEURO:  cn 2-12 grossly intact.   readily moves all 4's.  sensation is intact to touch on the feet SKIN:  Normal texture and temperature.  No rash or suspicious lesion is visible.   NODES:  None palpable at the neck PSYCH: alert, oriented x3.  Does not appear anxious nor depressed.       Assessment &  Plan:  Type 2 DM: needs increased rx Weight loss, possibly due to hyperglycemia Edema:  This is a relative contraindication to actos

## 2012-03-19 NOTE — Telephone Encounter (Signed)
Pt's pharmacy unable to get bromocriptine, is it possible to change to something different.

## 2012-03-21 DIAGNOSIS — E119 Type 2 diabetes mellitus without complications: Secondary | ICD-10-CM | POA: Insufficient documentation

## 2012-06-21 ENCOUNTER — Ambulatory Visit: Payer: BC Managed Care – PPO | Admitting: Endocrinology

## 2012-06-21 DIAGNOSIS — Z0289 Encounter for other administrative examinations: Secondary | ICD-10-CM

## 2015-11-25 ENCOUNTER — Other Ambulatory Visit: Payer: Self-pay | Admitting: Orthopedic Surgery

## 2015-11-25 DIAGNOSIS — M545 Low back pain, unspecified: Secondary | ICD-10-CM

## 2015-11-25 DIAGNOSIS — M79605 Pain in left leg: Secondary | ICD-10-CM

## 2015-12-03 ENCOUNTER — Ambulatory Visit
Admission: RE | Admit: 2015-12-03 | Discharge: 2015-12-03 | Disposition: A | Discharge status: Home / Self Care | Payer: Managed Care, Other (non HMO) | Source: Ambulatory Visit | Attending: Orthopedic Surgery | Admitting: Orthopedic Surgery

## 2015-12-03 DIAGNOSIS — M79605 Pain in left leg: Secondary | ICD-10-CM

## 2015-12-03 DIAGNOSIS — M545 Low back pain, unspecified: Secondary | ICD-10-CM

## 2016-09-15 ENCOUNTER — Ambulatory Visit (INDEPENDENT_AMBULATORY_CARE_PROVIDER_SITE_OTHER): Payer: Managed Care, Other (non HMO) | Admitting: Orthopaedic Surgery

## 2016-09-15 ENCOUNTER — Ambulatory Visit (INDEPENDENT_AMBULATORY_CARE_PROVIDER_SITE_OTHER): Payer: Self-pay

## 2016-09-15 ENCOUNTER — Encounter (INDEPENDENT_AMBULATORY_CARE_PROVIDER_SITE_OTHER): Payer: Self-pay | Admitting: Orthopaedic Surgery

## 2016-09-15 VITALS — BP 166/109 | HR 97 | Ht 67.0 in | Wt 220.0 lb

## 2016-09-15 DIAGNOSIS — M25562 Pain in left knee: Secondary | ICD-10-CM

## 2016-09-15 MED ORDER — BUPIVACAINE HCL 0.25 % IJ SOLN
0.6600 mL | INTRAMUSCULAR | Status: AC | PRN
  Administered 2016-09-15: .66 mL via INTRA_ARTICULAR

## 2016-09-15 MED ORDER — METHYLPREDNISOLONE ACETATE 40 MG/ML IJ SUSP
40.0000 mg | INTRAMUSCULAR | Status: AC | PRN
  Administered 2016-09-15: 40 mg via INTRA_ARTICULAR

## 2016-09-15 MED ORDER — LIDOCAINE HCL 1 % IJ SOLN
1.0000 mL | INTRAMUSCULAR | Status: AC | PRN
  Administered 2016-09-15: 1 mL

## 2016-09-15 NOTE — Progress Notes (Signed)
Office Visit Note   Patient: Alex Taylor           Date of Birth: 1960-11-19           MRN: [id number] Visit Date: 09/15/2016              Requested by: Lemmie Evens, MD [address] [place] [address] PCP: Lemmie Evens, MD   Assessment & Plan: Visit Diagnoses:  1. Acute pain of left knee    with synovitis  Plan: He'll return if he has persistent problems. Would consider rheumatologic workup. Since he hasn't had any problems in 5 years is may be related to some mild hyperuricemia please have her back she treated for gout. With this is increased pain related to activities more likely some chondromalacia with associated synovitis.  Follow-Up Instructions: No Follow-up on file.   Orders:  Orders Placed This Encounter  Procedures  . XR KNEE 3 VIEW LEFT   No orders of the defined types were placed in this encounter.     Procedures: Large Joint Inj Date/Time: 09/15/2016 3:00 PM Performed by: Marybelle Killings Authorized by: Marybelle Killings   Consent Given by:  Patient Site marked: the procedure site was marked   Indications:  Pain and joint swelling Location:  Knee Site:  L knee Needle Size:  22 G Needle Length:  1.5 inches Approach:  Anterolateral Ultrasound Guidance: No   Fluoroscopic Guidance: No   Arthrogram: No   Medications:  1 mL lidocaine 1 %; 40 mg methylPREDNISolone acetate 40 MG/ML; 0.66 mL bupivacaine 0.25 % Aspiration Attempted: No   Patient tolerance:  Patient tolerated the procedure well with no immediate complications     Clinical Data: No additional findings.   Subjective: Chief Complaint  Patient presents with  . Left Knee - Pain    HPI 56 year old male with the left knee pain and swelling 10 days. His feet a lot swells more by the end of the day with aching. Previous knee arthroscopy about 5 years ago at [facility]. He has had a history of gout but not in the last 5 years always been in his big toe. He's had some back  issues in the past relieved with an epidural but does not have any back and thigh pain associated with this. He denies fever chills.  Review of Systems previous finger lacerations while working using a knife. He has diabetes on metformin and glimepiride. He's used some hydrocodone for his back is allergic to penicillin. Past history positive for hypertension. Otherwise negative as it pertains to his history of present illness 14 point review of systems.   Objective: Vital Signs: BP (!) 166/109   Pulse 97   Ht 5\' 7"  (1.702 m)   Wt 220 lb (99.8 kg)   BMI 34.46 kg/m   Physical Exam  Constitutional: He is oriented to person, place, and time. He appears well-developed and well-nourished.  HENT:  Head: Normocephalic and atraumatic.  Eyes: Pupils are equal, round, and reactive to light. EOM are normal.  Neck: No tracheal deviation present. No thyromegaly present.  Cardiovascular: Normal rate.   Pulmonary/Chest: Effort normal. He has no wheezes.  Abdominal: Soft. Bowel sounds are normal.  Musculoskeletal:  There is a 3+ knee effusion. Collateral cruciate ligament exam is normally standard diffusely over his knee. With knee flexion there is some prominence of superior lateral adjacent to his old arthroscopic portal. Distal pulses are intact operative hip range of motion negative straight leg  raising 90 no sciatic notch tenderness. Opposite right knee shows no knee effusion no tenderness for range of motion.  Neurological: He is alert and oriented to person, place, and time.  Skin: Skin is warm and dry. Capillary refill takes less than 2 seconds.  Psychiatric: He has a normal mood and affect. His behavior is normal. Judgment and thought content normal.    Ortho Exam  Specialty Comments:  No specialty comments available.  Imaging: No results found.   PMFS History: Patient Active Problem List   Diagnosis Date Noted  . DM (diabetes mellitus) ([place]) 03/21/2012  . Difficulty in  walking(719.7) 10/28/2011  . LACERATION, HAND, RIGHT 05/29/2008  . OPEN WOUND OF FINGER WITH TENDON INVOLVEMENT 05/29/2008   Past Medical History:  Diagnosis Date  . Diabetes mellitus without complication ([place])     No family history on file.  Past Surgical History:  Procedure Laterality Date  . BACK SURGERY    . COLONOSCOPY  12/20/2011   Procedure: COLONOSCOPY;  Surgeon: Jamesetta So, MD;  Location[facility];  Service: Gastroenterology;  Laterality: N/A;  . KNEE ARTHROSCOPY     left knee   Social History   Occupational History  . Not on file.   Social History Main Topics  . Smoking status: Former Research scientist (life sciences)  . Smokeless tobacco: Never Used  . Alcohol use Yes     Comment: occ  . Drug use: No  . Sexual activity: Not on file

## 2016-11-28 ENCOUNTER — Encounter (HOSPITAL_COMMUNITY): Payer: Self-pay

## 2016-11-28 ENCOUNTER — Emergency Department (HOSPITAL_COMMUNITY)
Admission: EM | Admit: 2016-11-28 | Discharge: 2016-11-28 | Disposition: A | Discharge status: Home / Self Care | Payer: Managed Care, Other (non HMO) | Attending: Emergency Medicine | Admitting: Emergency Medicine

## 2016-11-28 ENCOUNTER — Emergency Department (HOSPITAL_COMMUNITY): Payer: Managed Care, Other (non HMO)

## 2016-11-28 DIAGNOSIS — Z7984 Long term (current) use of oral hypoglycemic drugs: Secondary | ICD-10-CM | POA: Diagnosis not present

## 2016-11-28 DIAGNOSIS — Z87891 Personal history of nicotine dependence: Secondary | ICD-10-CM | POA: Insufficient documentation

## 2016-11-28 DIAGNOSIS — Z79899 Other long term (current) drug therapy: Secondary | ICD-10-CM | POA: Diagnosis not present

## 2016-11-28 DIAGNOSIS — E119 Type 2 diabetes mellitus without complications: Secondary | ICD-10-CM | POA: Insufficient documentation

## 2016-11-28 DIAGNOSIS — M19012 Primary osteoarthritis, left shoulder: Secondary | ICD-10-CM | POA: Diagnosis not present

## 2016-11-28 DIAGNOSIS — M25512 Pain in left shoulder: Secondary | ICD-10-CM | POA: Diagnosis present

## 2016-11-28 DIAGNOSIS — M7532 Calcific tendinitis of left shoulder: Secondary | ICD-10-CM

## 2016-11-28 LAB — URIC ACID: URIC ACID, SERUM: 4 mg/dL — AB (ref 4.4–7.6)

## 2016-11-28 MED ORDER — IBUPROFEN 800 MG PO TABS
800.0000 mg | ORAL_TABLET | Freq: Once | ORAL | Status: AC
  Administered 2016-11-28: 800 mg via ORAL
  Filled 2016-11-28: qty 1

## 2016-11-28 MED ORDER — HYDROCODONE-ACETAMINOPHEN 5-325 MG PO TABS: 1.0000 | ORAL_TABLET | ORAL | 0 refills | Status: DC | PRN

## 2016-11-28 MED ORDER — DICLOFENAC SODIUM 3 % TD GEL: TRANSDERMAL | 1 refills | Status: DC

## 2016-11-28 MED ORDER — ACETAMINOPHEN 325 MG PO TABS
650.0000 mg | ORAL_TABLET | Freq: Once | ORAL | Status: AC
  Administered 2016-11-28: 650 mg via ORAL
  Filled 2016-11-28: qty 2

## 2016-11-28 NOTE — ED Provider Notes (Signed)
[facility] DEPT Provider Note   CSN: [id number] Arrival date & time: 11/28/16  1324     History   Chief Complaint Chief Complaint  Patient presents with  . Shoulder Pain    HPI Alex Taylor is a 56 y.o. male.  The history is provided by the patient.  Shoulder Pain   The current episode started more than 2 days ago. The problem occurs daily. The problem has been gradually worsening. The pain is present in the left shoulder. The quality of the pain is described as aching. The pain is moderate. Associated symptoms include limited range of motion and stiffness. The symptoms are aggravated by activity. He has tried nothing for the symptoms. There has been no history of extremity trauma.    Past Medical History:  Diagnosis Date  . Diabetes mellitus without complication [facility])     Patient Active Problem List   Diagnosis Date Noted  . DM (diabetes mellitus) ([place]) 03/21/2012  . Difficulty in walking(719.7) 10/28/2011  . LACERATION, HAND, RIGHT 05/29/2008  . OPEN WOUND OF FINGER WITH TENDON INVOLVEMENT 05/29/2008    Past Surgical History:  Procedure Laterality Date  . BACK SURGERY    . COLONOSCOPY  12/20/2011   Procedure: COLONOSCOPY;  Surgeon: Jamesetta So, MD;  Location[facility];  Service: Gastroenterology;  Laterality: N/A;  . KNEE ARTHROSCOPY     left knee       Home Medications    Prior to Admission medications   Medication Sig Start Date End Date Taking? Authorizing Provider  bromocriptine (PARLODEL) 2.5 MG tablet Take 1 tablet (2.5 mg total) by mouth at bedtime. Patient not taking: Reported on 09/15/2016 03/19/12   Renato Shin, MD  glimepiride [facility]) 1 MG tablet  08/31/16   [provider]  HYDROcodone-acetaminophen [facility]) 10-325 MG tablet  08/01/16   [provider]  ibuprofen (ADVIL,MOTRIN) 200 MG tablet Take 200 mg by mouth every 6 (six) hours as needed. For pain    [provider]  losartan (COZAAR) 50 MG tablet   08/31/16   [provider]  lovastatin (MEVACOR) 10 MG tablet Take 10 mg by mouth at bedtime.    [provider]  metFORMIN (GLUCOPHAGE) 500 MG tablet Take 500 mg by mouth 2 (two) times daily with a meal.    [provider]    Family History No family history on file.  Social History Social History  Substance Use Topics  . Smoking status: Former Research scientist (life sciences)  . Smokeless tobacco: Never Used  . Alcohol use Yes     Comment: occ     Allergies   Codeine and Penicillins   Review of Systems Review of Systems  Constitutional: Negative for activity change.       All ROS Neg except as noted in HPI  HENT: Negative for nosebleeds.   Eyes: Negative for photophobia and discharge.  Respiratory: Negative for cough, shortness of breath and wheezing.   Cardiovascular: Negative for chest pain and palpitations.  Gastrointestinal: Negative for abdominal pain and blood in stool.  Genitourinary: Negative for dysuria, frequency and hematuria.  Musculoskeletal: Positive for arthralgias and stiffness. Negative for back pain and neck pain.  Skin: Negative.   Neurological: Negative for dizziness, seizures and speech difficulty.  Psychiatric/Behavioral: Negative for confusion and hallucinations.     Physical Exam Updated Vital Signs BP (!) 167/96 (BP Location: Right Arm)   Pulse 93   Temp 98.1 F (36.7 C) (Oral)   Resp 18   Ht  5\' 7"  (1.702 m)   Wt 98.4 kg (217 lb)   SpO2 99%   BMI 33.99 kg/m   Physical Exam  Constitutional: He is oriented to person, place, and time. He appears well-developed and well-nourished.  Non-toxic appearance.  HENT:  Head: Normocephalic.  Right Ear: Tympanic membrane and external ear normal.  Left Ear: Tympanic membrane and external ear normal.  Eyes: Pupils are equal, round, and reactive to light. EOM and lids are normal.  Neck: Normal range of motion. Neck supple. Carotid bruit is not present.  Cardiovascular: Normal rate, regular rhythm,  normal heart sounds, intact distal pulses and normal pulses.   Pulmonary/Chest: Breath sounds normal. No respiratory distress.  Abdominal: Soft. Bowel sounds are normal. There is no tenderness. There is no guarding.  Musculoskeletal:       Left shoulder: He exhibits decreased range of motion and pain. He exhibits no deformity.       Arms: Lymphadenopathy:       Head (right side): No submandibular adenopathy present.       Head (left side): No submandibular adenopathy present.    He has no cervical adenopathy.  Neurological: He is alert and oriented to person, place, and time. He has normal strength. No cranial nerve deficit or sensory deficit.  Skin: Skin is warm and dry.  Psychiatric: He has a normal mood and affect. His speech is normal.  Nursing note and vitals reviewed.    ED Treatments / Results  Labs (all labs ordered are listed, but only abnormal results are displayed) Labs Reviewed - No data to display  EKG  EKG Interpretation None       Radiology No results found.  Procedures Procedures (including critical care time)  Medications Ordered in ED Medications - No data to display   Initial Impression / Assessment and Plan / ED Course  I have reviewed the triage vital signs and the nursing notes.  Pertinent labs & imaging results that were available during my care of the patient were reviewed by me and considered in my medical decision making (see chart for details).       Final Clinical Impressions(s) / ED Diagnoses MDm Vital signs within normal limits. There no gross neurologic deficits appreciated. No gross vascular deficits appreciated of the left or right upper extremity.  X-ray of the left shoulder show soft tissue calcifications between the acromion in the greater tuberosity suggesting calcific tendinitis or bursitis. No fracture or dislocation noted. Is osteoarthritis present.  I've discussed these findings with the patient in terms which he  understands. The patient is referred to orthopedics. Prescription for diclofenac gel and Norco given to the patient. The patient has a sling have asked him to continue to use until he seen by the orthopedic specialist. Patient is in agreement with this plan.    Final diagnoses:  Calcific tendonitis of left shoulder  Primary osteoarthritis of left shoulder    New Prescriptions Discharge Medication List as of 11/28/2016  1:57 PM    START taking these medications   Details  Diclofenac Sodium 3 % GEL Apply to shoulder tid, Print    HYDROcodone-acetaminophen (NORCO/VICODIN) 5-325 MG tablet Take 1 tablet by mouth every 4 (four) hours as needed. Take with food, Starting Mon 11/28/2016, Print         Lily Kocher, PA-C 11/29/16 1918    Julianne Rice, MD 12/02/16 [phone]

## 2016-11-28 NOTE — Discharge Instructions (Signed)
Please use a heating pad to the left shoulder when at rest. Use diclofenac gel 3 times daily. Use Tylenol for mild pain, use Norco for more severe pain.This medication may cause drowsiness. Please do not drink, drive, or participate in activity that requires concentration while taking this medication.  Please use your sling until seen by Dr. Para March or member of his staff.

## 2016-11-28 NOTE — ED Triage Notes (Signed)
Reports of left shoulder pain and tenderness since Friday. Denies injury. Tenderness noted to palpation to left anterior shoulder.

## 2017-10-02 ENCOUNTER — Other Ambulatory Visit (HOSPITAL_COMMUNITY): Payer: Self-pay | Admitting: Family Medicine

## 2017-10-02 ENCOUNTER — Ambulatory Visit (HOSPITAL_COMMUNITY)
Admission: RE | Admit: 2017-10-02 | Discharge: 2017-10-02 | Disposition: A | Discharge status: Home / Self Care | Payer: 59 | Source: Ambulatory Visit | Attending: Family Medicine | Admitting: Family Medicine

## 2017-10-02 DIAGNOSIS — M25522 Pain in left elbow: Secondary | ICD-10-CM

## 2017-10-02 DIAGNOSIS — M19022 Primary osteoarthritis, left elbow: Secondary | ICD-10-CM | POA: Diagnosis not present

## 2017-10-02 DIAGNOSIS — M79641 Pain in right hand: Secondary | ICD-10-CM

## 2017-10-02 DIAGNOSIS — M19041 Primary osteoarthritis, right hand: Secondary | ICD-10-CM | POA: Insufficient documentation

## 2018-10-31 DIAGNOSIS — N390 Urinary tract infection, site not specified: Secondary | ICD-10-CM | POA: Diagnosis not present

## 2018-10-31 DIAGNOSIS — Z79891 Long term (current) use of opiate analgesic: Secondary | ICD-10-CM | POA: Diagnosis not present

## 2018-10-31 DIAGNOSIS — E7800 Pure hypercholesterolemia, unspecified: Secondary | ICD-10-CM | POA: Diagnosis not present

## 2018-10-31 DIAGNOSIS — E039 Hypothyroidism, unspecified: Secondary | ICD-10-CM | POA: Diagnosis not present

## 2018-10-31 DIAGNOSIS — I10 Essential (primary) hypertension: Secondary | ICD-10-CM | POA: Diagnosis not present

## 2018-10-31 DIAGNOSIS — E1165 Type 2 diabetes mellitus with hyperglycemia: Secondary | ICD-10-CM | POA: Diagnosis not present

## 2018-10-31 DIAGNOSIS — R5383 Other fatigue: Secondary | ICD-10-CM | POA: Diagnosis not present

## 2018-10-31 DIAGNOSIS — E119 Type 2 diabetes mellitus without complications: Secondary | ICD-10-CM | POA: Diagnosis not present

## 2018-10-31 DIAGNOSIS — E782 Mixed hyperlipidemia: Secondary | ICD-10-CM | POA: Diagnosis not present

## 2018-10-31 DIAGNOSIS — Z125 Encounter for screening for malignant neoplasm of prostate: Secondary | ICD-10-CM | POA: Diagnosis not present

## 2018-10-31 LAB — COMPREHENSIVE METABOLIC PANEL: GFR calc non Af Amer: 103

## 2018-10-31 LAB — BASIC METABOLIC PANEL
BUN: 14 (ref 4–21)
Creatinine: 0.7 (ref 0.6–1.3)
Glucose: 255

## 2018-10-31 LAB — LIPID PANEL
LDL Cholesterol: 72
Triglycerides: 156 (ref 40–160)

## 2018-10-31 LAB — HEMOGLOBIN A1C: Hemoglobin A1C: 9.6

## 2018-12-28 MED FILL — LOVASTATIN 10 MG TABS: 10 | 90 days supply | Qty: 90 | Fill #0

## 2018-12-28 MED FILL — GLIMEPIRIDE 2 MG TABLET: 2 | 90 days supply | Qty: 90 | Fill #0

## 2018-12-31 MED FILL — metFORMIN HCL 500 MG TABS: 500 | 90 days supply | Qty: 360 | Fill #0

## 2019-01-02 MED FILL — metFORMIN HCL ER 500 MG TB2: 500 | 90 days supply | Qty: 360 | Fill #0

## 2019-01-08 MED FILL — LOSARTAN POTASSIUM 50 MG TA: 50 | 90 days supply | Qty: 90 | Fill #0

## 2019-01-14 IMAGING — DX DG ELBOW COMPLETE 3+V*L*
4 series · 4 of 4 positions shown · non-contrast
Comparison: None.

CLINICAL DATA: Initial evaluation for chronic left elbow pain at
[facility]. No known injury.

EXAM:
LEFT ELBOW - COMPLETE 3+ VIEW

[elbow ap]
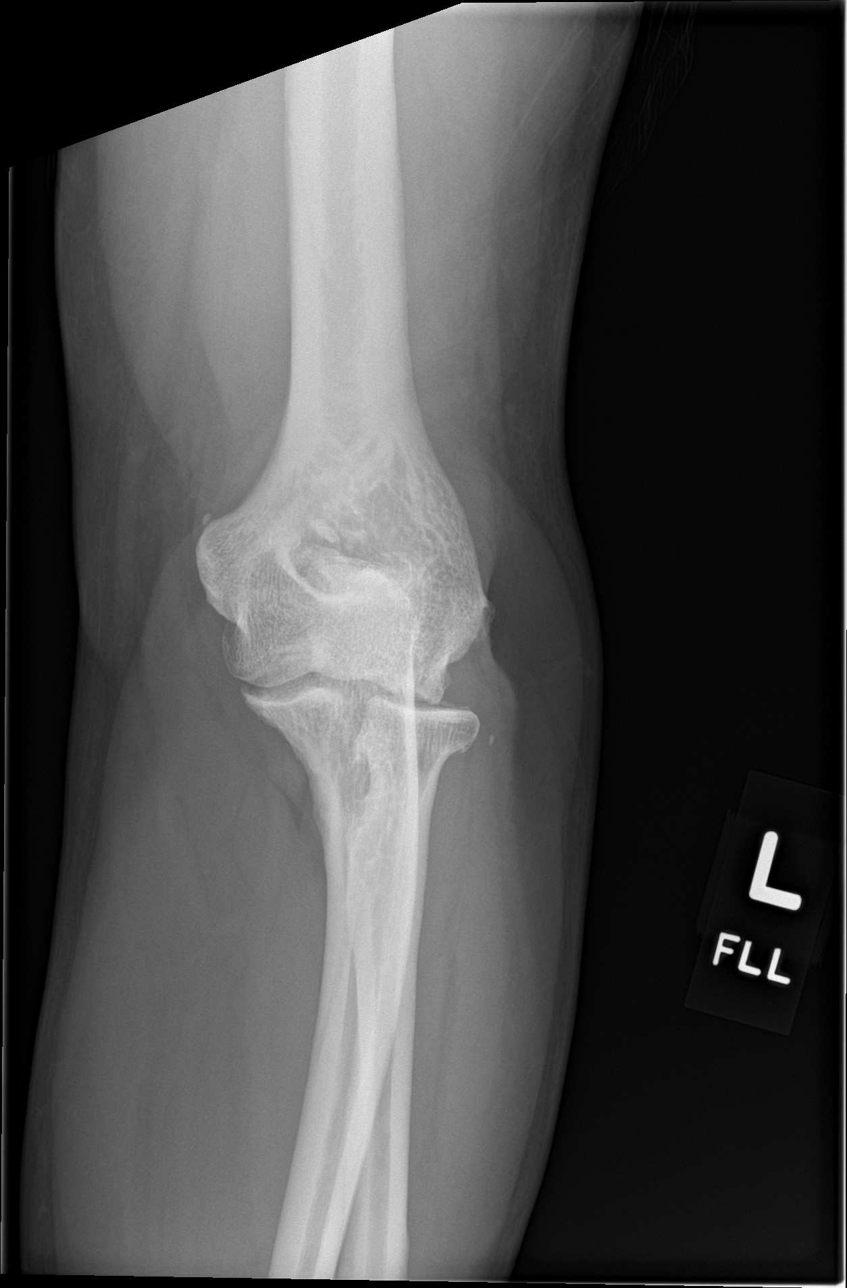

[elbow obl (1 of 2)]
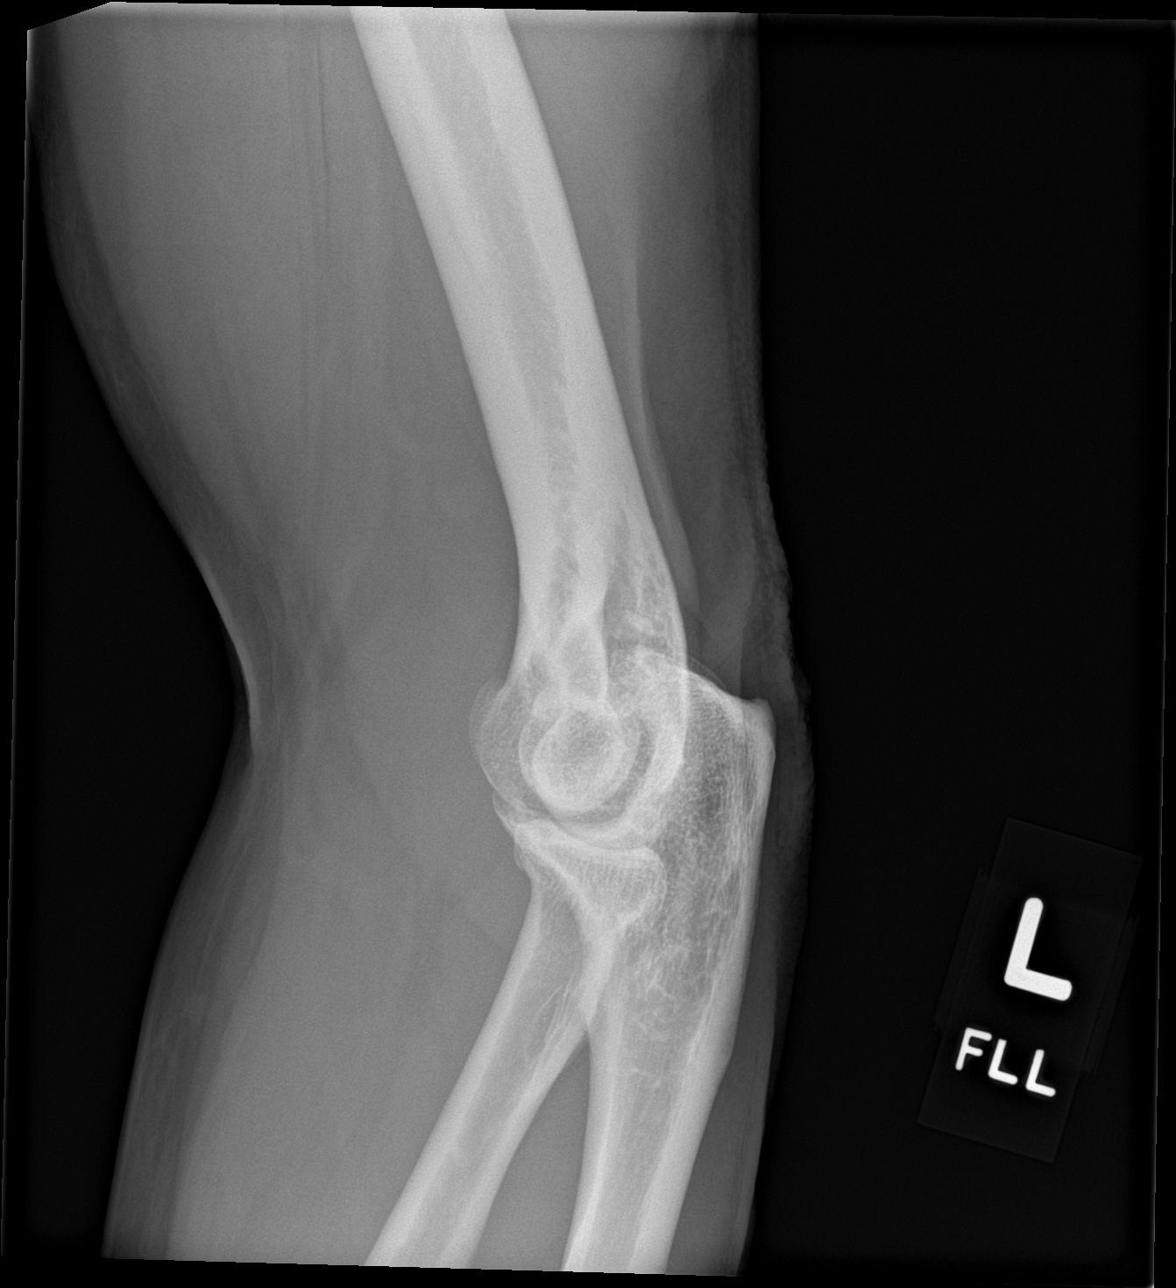

[elbow lat]
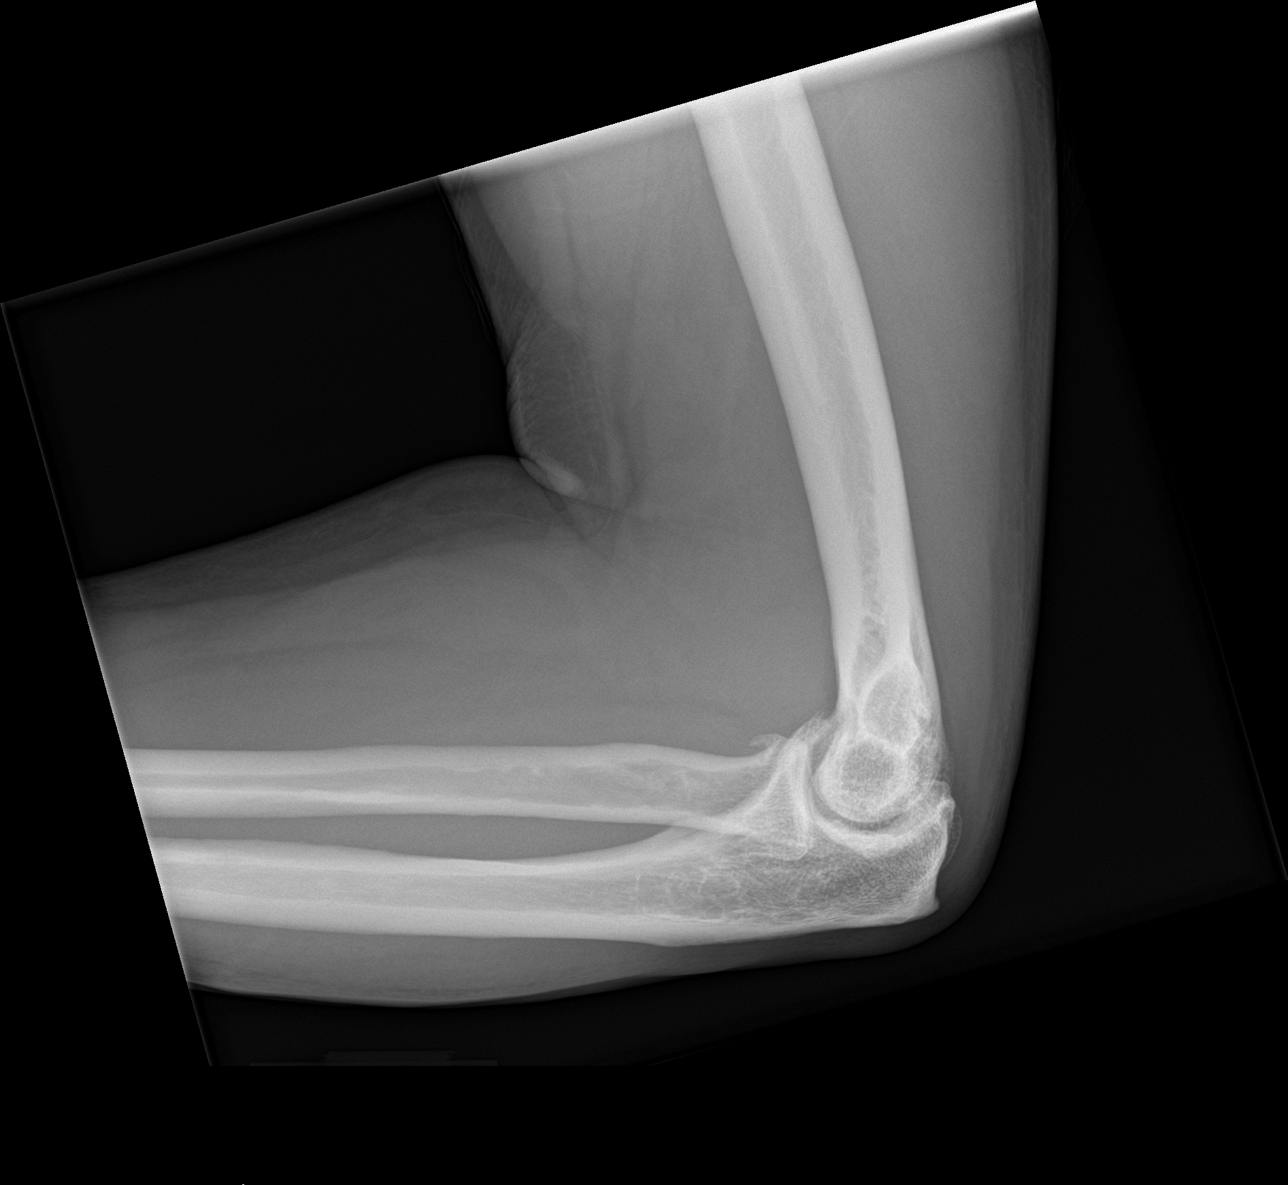

[elbow obl (2 of 2)]
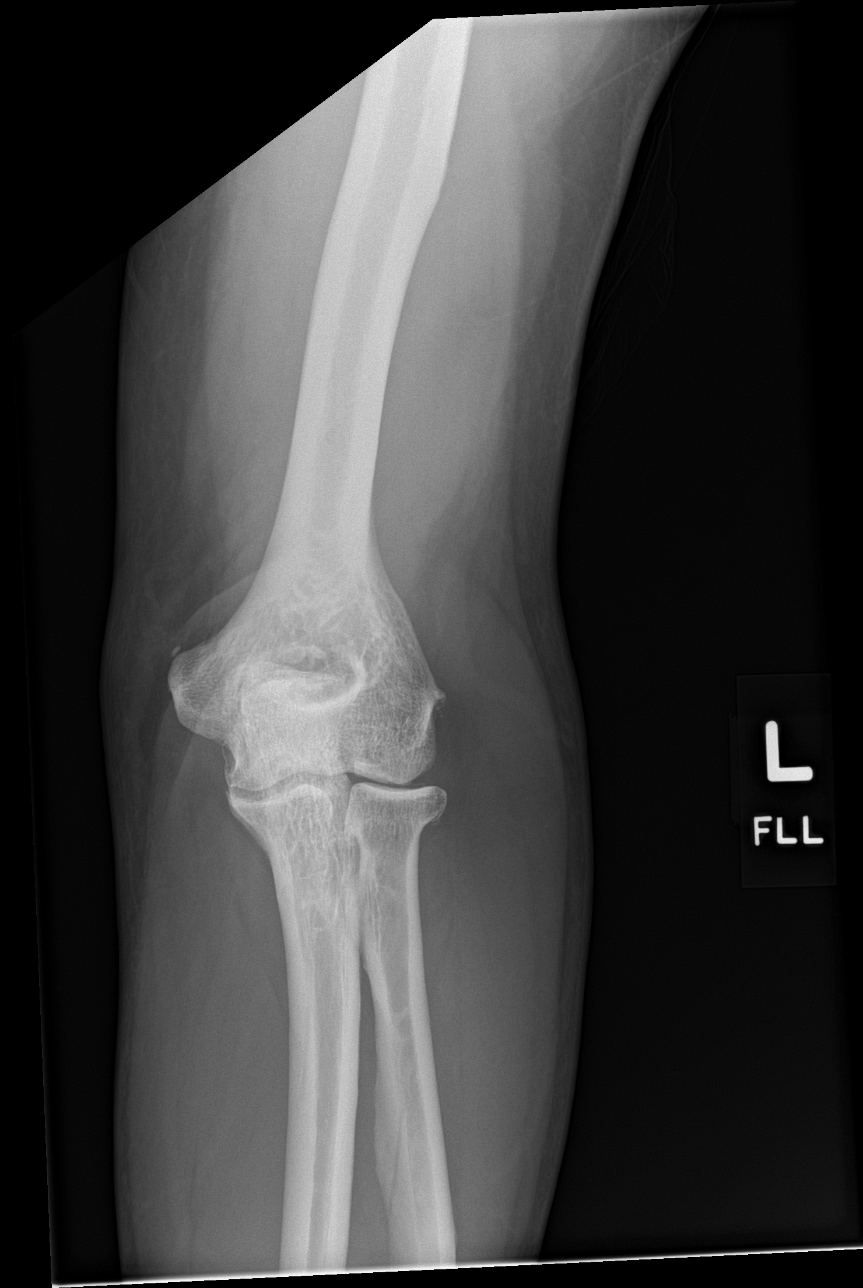

[4 of 4 positions shown; findings below may reference images not displayed]

FINDINGS: No acute fracture or dislocation. No joint effusion. Moderate
osteophytic spurring present at the radial head. Heterotopic
calcification present about the elbow, most notable at the
mediolateral epicondyles, suggesting calcific tendinopathy. Osseous
mineralization normal.
IMPRESSION: 1. No acute osseous abnormality about the elbow.
2. Moderate degenerative osteoarthritic changes with heterotopic
calcification about the elbow, suggesting underlying calcific
tendinopathy.

## 2019-02-18 ENCOUNTER — Telehealth: Payer: 59 | Admitting: Family

## 2019-02-18 DIAGNOSIS — R300 Dysuria: Secondary | ICD-10-CM

## 2019-02-18 NOTE — Progress Notes (Signed)
Based on what you shared with me, I feel your condition warrants further evaluation and I recommend that you be seen for a face to face office visit.  Male bladder infections are not very common.  We worry about prostate or kidney conditions.  The standard of care is to examine the abdomen and kidneys, and to do a urine and blood test to make sure that something more serious is not going on.  We recommend that you see a provider today.  If your doctor's office is closed [facility] has the following Urgent Cares:    NOTE: If you entered your credit card information for this eVisit, you will not be charged. You may see a "hold" on your card for the $35 but that hold will drop off and you will not have a charge processed.   If you are having a true medical emergency please call 911.     For an urgent face to face visit, [facility] has four urgent care centers for your convenience:    NEW:  [facility] [phone] [address] [place], [place] [address] .  Monday - Friday 10 am - 6 pm    . [facility]    [phone]                  Get Driving Directions  [address] [place], [place] [address] . 10 am to 8 pm Monday-Friday . 12 pm to 8 pm Saturday-Sunday   . [facility]  [phone]                  Get Driving Directions  [address], [address] [place], [place] [address] . 8 am to 8 pm Monday-Friday . 9 am to 6 pm Saturday . 11 am to 6 pm Sunday   . [facility] [place]  [phone]                  Get Driving Directions   [address] [place], [place] [address] . 8 am to 8 pm Monday-Friday . 8 am to 4 pm Saturday-Sunday    . [facility]                    Get Driving Directions  [phone]  [address] [place], [place] [address]  . Monday-Friday, 12 PM to 6 PM    Your e-visit  answers were reviewed by a board certified advanced clinical practitioner to complete your personal care plan.  Thank you for using e-Visits.  

## 2019-03-05 DIAGNOSIS — M25562 Pain in left knee: Secondary | ICD-10-CM | POA: Diagnosis not present

## 2019-03-05 DIAGNOSIS — E1165 Type 2 diabetes mellitus with hyperglycemia: Secondary | ICD-10-CM | POA: Diagnosis not present

## 2019-03-05 DIAGNOSIS — Z79891 Long term (current) use of opiate analgesic: Secondary | ICD-10-CM | POA: Diagnosis not present

## 2019-03-05 DIAGNOSIS — I10 Essential (primary) hypertension: Secondary | ICD-10-CM | POA: Diagnosis not present

## 2019-04-01 MED FILL — LOVASTATIN 10 MG TABS: 10 | 90 days supply | Qty: 90 | Fill #1

## 2019-04-15 MED FILL — metFORMIN HCL ER 500 MG TB2: 500 | 90 days supply | Qty: 360 | Fill #1

## 2019-04-15 MED FILL — GLIMEPIRIDE 2 MG TABLET: 2 | 90 days supply | Qty: 90 | Fill #1

## 2019-04-17 MED FILL — LOSARTAN POTASSIUM 50 MG TA: 50 | 90 days supply | Qty: 90 | Fill #0

## 2019-05-29 ENCOUNTER — Other Ambulatory Visit (HOSPITAL_COMMUNITY): Payer: Self-pay | Admitting: Family Medicine

## 2019-05-29 DIAGNOSIS — I10 Essential (primary) hypertension: Secondary | ICD-10-CM | POA: Diagnosis not present

## 2019-05-29 DIAGNOSIS — E1165 Type 2 diabetes mellitus with hyperglycemia: Secondary | ICD-10-CM | POA: Diagnosis not present

## 2019-05-29 DIAGNOSIS — E782 Mixed hyperlipidemia: Secondary | ICD-10-CM | POA: Diagnosis not present

## 2019-05-29 DIAGNOSIS — M25562 Pain in left knee: Secondary | ICD-10-CM | POA: Diagnosis not present

## 2019-06-27 MED FILL — LOVASTATIN 10 MG TABS: 10 | 90 days supply | Qty: 90 | Fill #2

## 2019-07-15 MED FILL — LOSARTAN POTASSIUM 50 MG TA: 50 | 90 days supply | Qty: 90 | Fill #1

## 2019-07-26 MED FILL — metFORMIN HCL ER 500 MG TB2: 500 | 90 days supply | Qty: 360 | Fill #2

## 2019-07-26 MED FILL — GLIMEPIRIDE 2 MG TABLET: 2 | 90 days supply | Qty: 90 | Fill #2

## 2019-07-29 ENCOUNTER — Other Ambulatory Visit (HOSPITAL_COMMUNITY)
Admission: RE | Admit: 2019-07-29 | Discharge: 2019-07-29 | Disposition: A | Discharge status: Home / Self Care | Payer: Self-pay | Source: Ambulatory Visit | Attending: Family Medicine | Admitting: Family Medicine

## 2019-07-29 ENCOUNTER — Other Ambulatory Visit: Payer: Self-pay

## 2019-07-29 DIAGNOSIS — E782 Mixed hyperlipidemia: Secondary | ICD-10-CM | POA: Insufficient documentation

## 2019-07-29 DIAGNOSIS — E119 Type 2 diabetes mellitus without complications: Secondary | ICD-10-CM | POA: Insufficient documentation

## 2019-07-29 DIAGNOSIS — E039 Hypothyroidism, unspecified: Secondary | ICD-10-CM | POA: Insufficient documentation

## 2019-07-29 DIAGNOSIS — R5383 Other fatigue: Secondary | ICD-10-CM | POA: Insufficient documentation

## 2019-07-29 DIAGNOSIS — Z7901 Long term (current) use of anticoagulants: Secondary | ICD-10-CM | POA: Insufficient documentation

## 2019-07-29 DIAGNOSIS — E7800 Pure hypercholesterolemia, unspecified: Secondary | ICD-10-CM | POA: Insufficient documentation

## 2019-07-29 DIAGNOSIS — I10 Essential (primary) hypertension: Secondary | ICD-10-CM | POA: Insufficient documentation

## 2019-07-29 DIAGNOSIS — E1165 Type 2 diabetes mellitus with hyperglycemia: Secondary | ICD-10-CM | POA: Insufficient documentation

## 2019-07-29 DIAGNOSIS — Z79891 Long term (current) use of opiate analgesic: Secondary | ICD-10-CM | POA: Insufficient documentation

## 2019-07-29 LAB — BASIC METABOLIC PANEL
Anion gap: 9 (ref 5–15)
BUN: 12 mg/dL (ref 6–20)
CO2: 24 mmol/L (ref 22–32)
Calcium: 8.6 mg/dL — ABNORMAL LOW (ref 8.9–10.3)
Chloride: 103 mmol/L (ref 98–111)
Creatinine, Ser: 0.6 mg/dL — ABNORMAL LOW (ref 0.61–1.24)
GFR calc Af Amer: >60 mL/min (ref >60)
GFR calc non Af Amer: >60 mL/min (ref >60)
Glucose, Bld: 196 mg/dL — ABNORMAL HIGH (ref 70–99)
Potassium: 4.3 mmol/L (ref 3.5–5.1)
Sodium: 136 mmol/L (ref 135–145)

## 2019-07-29 LAB — HEMOGLOBIN A1C
Hgb A1c MFr Bld: 9 % — ABNORMAL HIGH (ref 4.8–5.6)
Mean Plasma Glucose: 211.6 mg/dL

## 2019-09-22 DIAGNOSIS — Z23 Encounter for immunization: Secondary | ICD-10-CM | POA: Diagnosis not present

## 2019-09-25 MED FILL — LOVASTATIN 10 MG TABS: 10 | 90 days supply | Qty: 90 | Fill #3

## 2019-10-13 MED FILL — LOSARTAN POTASSIUM 50 MG TA: 50 | 90 days supply | Qty: 90 | Fill #2

## 2019-10-20 DIAGNOSIS — Z23 Encounter for immunization: Secondary | ICD-10-CM | POA: Diagnosis not present

## 2019-10-30 MED FILL — GLIMEPIRIDE 2 MG TABS: 2 | 90 days supply | Qty: 90 | Fill #3

## 2019-11-01 ENCOUNTER — Other Ambulatory Visit (HOSPITAL_COMMUNITY): Payer: Self-pay | Admitting: Nurse Practitioner

## 2019-11-01 DIAGNOSIS — E1165 Type 2 diabetes mellitus with hyperglycemia: Secondary | ICD-10-CM | POA: Diagnosis not present

## 2019-11-01 DIAGNOSIS — E782 Mixed hyperlipidemia: Secondary | ICD-10-CM | POA: Diagnosis not present

## 2019-11-01 DIAGNOSIS — I10 Essential (primary) hypertension: Secondary | ICD-10-CM | POA: Diagnosis not present

## 2019-11-01 DIAGNOSIS — F1020 Alcohol dependence, uncomplicated: Secondary | ICD-10-CM | POA: Diagnosis not present

## 2019-11-19 DIAGNOSIS — E7800 Pure hypercholesterolemia, unspecified: Secondary | ICD-10-CM | POA: Diagnosis not present

## 2019-11-19 DIAGNOSIS — R5383 Other fatigue: Secondary | ICD-10-CM | POA: Diagnosis not present

## 2019-11-19 DIAGNOSIS — Z79891 Long term (current) use of opiate analgesic: Secondary | ICD-10-CM | POA: Diagnosis not present

## 2019-11-19 DIAGNOSIS — E1165 Type 2 diabetes mellitus with hyperglycemia: Secondary | ICD-10-CM | POA: Diagnosis not present

## 2019-11-19 DIAGNOSIS — E119 Type 2 diabetes mellitus without complications: Secondary | ICD-10-CM | POA: Diagnosis not present

## 2019-11-19 DIAGNOSIS — I10 Essential (primary) hypertension: Secondary | ICD-10-CM | POA: Diagnosis not present

## 2019-11-19 DIAGNOSIS — E782 Mixed hyperlipidemia: Secondary | ICD-10-CM | POA: Diagnosis not present

## 2019-11-19 DIAGNOSIS — E039 Hypothyroidism, unspecified: Secondary | ICD-10-CM | POA: Diagnosis not present

## 2019-11-19 DIAGNOSIS — Z7901 Long term (current) use of anticoagulants: Secondary | ICD-10-CM | POA: Diagnosis not present

## 2019-11-19 LAB — HEMOGLOBIN A1C: Hemoglobin A1C: 9.5

## 2019-11-19 LAB — BASIC METABOLIC PANEL
BUN: 13 (ref 4–21)
Creatinine: 0.8 (ref 0.6–1.3)
Glucose: 270

## 2019-11-19 LAB — COMPREHENSIVE METABOLIC PANEL: GFR calc non Af Amer: 100

## 2019-12-26 MED FILL — metFORMIN HCL ER 500 MG TB2: 500 | 90 days supply | Qty: 360 | Fill #3

## 2019-12-27 MED FILL — LOSARTAN POTASSIUM 50 MG TA: 50 | 90 days supply | Qty: 90 | Fill #3

## 2019-12-27 MED FILL — LOVASTATIN 10 MG TABS: 10 | 90 days supply | Qty: 90 | Fill #0

## 2020-01-10 MED FILL — GLIMEPIRIDE 2 MG TABS: 2 | 90 days supply | Qty: 90 | Fill #0

## 2020-02-17 ENCOUNTER — Other Ambulatory Visit (HOSPITAL_COMMUNITY): Payer: Self-pay | Admitting: Family Medicine

## 2020-02-17 DIAGNOSIS — I10 Essential (primary) hypertension: Secondary | ICD-10-CM | POA: Diagnosis not present

## 2020-02-17 DIAGNOSIS — E1165 Type 2 diabetes mellitus with hyperglycemia: Secondary | ICD-10-CM | POA: Diagnosis not present

## 2020-02-17 DIAGNOSIS — E782 Mixed hyperlipidemia: Secondary | ICD-10-CM | POA: Diagnosis not present

## 2020-02-17 DIAGNOSIS — Z23 Encounter for immunization: Secondary | ICD-10-CM | POA: Diagnosis not present

## 2020-02-17 DIAGNOSIS — E6609 Other obesity due to excess calories: Secondary | ICD-10-CM | POA: Diagnosis not present

## 2020-03-26 MED FILL — LOVASTATIN 10 MG TABS: 10 | 90 days supply | Qty: 90 | Fill #1

## 2020-03-26 MED FILL — LOSARTAN POTASSIUM 50 MG TA: 50 | 90 days supply | Qty: 90 | Fill #0

## 2020-03-26 MED FILL — GLIMEPIRIDE 4 MG TABS: 4 | 90 days supply | Qty: 90 | Fill #0

## 2020-04-04 ENCOUNTER — Other Ambulatory Visit (HOSPITAL_COMMUNITY): Payer: Self-pay | Admitting: Family Medicine

## 2020-04-13 DIAGNOSIS — E119 Type 2 diabetes mellitus without complications: Secondary | ICD-10-CM | POA: Diagnosis not present

## 2020-04-13 LAB — HEMOGLOBIN A1C: Hemoglobin A1C: 10.8

## 2020-04-13 LAB — BASIC METABOLIC PANEL
BUN: 17 (ref 4–21)
Creatinine: 0.7 (ref 0.6–1.3)
Glucose: 255

## 2020-04-13 LAB — COMPREHENSIVE METABOLIC PANEL: GFR calc non Af Amer: 103

## 2020-04-17 MED FILL — metFORMIN HCL ER 500 MG TB2: 500 | 90 days supply | Qty: 360 | Fill #0

## 2020-04-23 ENCOUNTER — Other Ambulatory Visit (HOSPITAL_COMMUNITY): Payer: Self-pay | Admitting: Family Medicine

## 2020-04-29 DIAGNOSIS — Z79891 Long term (current) use of opiate analgesic: Secondary | ICD-10-CM | POA: Diagnosis not present

## 2020-04-29 DIAGNOSIS — M25562 Pain in left knee: Secondary | ICD-10-CM | POA: Diagnosis not present

## 2020-04-29 DIAGNOSIS — E1165 Type 2 diabetes mellitus with hyperglycemia: Secondary | ICD-10-CM | POA: Diagnosis not present

## 2020-04-29 DIAGNOSIS — R252 Cramp and spasm: Secondary | ICD-10-CM | POA: Diagnosis not present

## 2020-05-12 ENCOUNTER — Other Ambulatory Visit (HOSPITAL_BASED_OUTPATIENT_CLINIC_OR_DEPARTMENT_OTHER): Payer: Self-pay

## 2020-05-26 ENCOUNTER — Other Ambulatory Visit (HOSPITAL_COMMUNITY): Payer: Self-pay

## 2020-05-26 MED ORDER — HYDROCODONE-ACETAMINOPHEN 10-325 MG PO TABS
1.0000 | ORAL_TABLET | Freq: Four times a day (QID) | ORAL | 0 refills | Status: DC
  Filled 2020-06-04: qty 120, 30d supply, fill #0

## 2020-05-27 ENCOUNTER — Other Ambulatory Visit (HOSPITAL_COMMUNITY): Payer: Self-pay

## 2020-06-04 ENCOUNTER — Other Ambulatory Visit (HOSPITAL_COMMUNITY): Payer: Self-pay

## 2020-06-05 ENCOUNTER — Other Ambulatory Visit (HOSPITAL_COMMUNITY): Payer: Self-pay

## 2020-06-22 ENCOUNTER — Other Ambulatory Visit (HOSPITAL_COMMUNITY): Payer: Self-pay

## 2020-06-22 MED FILL — Lovastatin Tab 10 MG: ORAL | 90 days supply | Qty: 90 | Fill #0 | Status: AC

## 2020-06-23 ENCOUNTER — Other Ambulatory Visit (HOSPITAL_COMMUNITY): Payer: Self-pay

## 2020-06-25 ENCOUNTER — Other Ambulatory Visit (HOSPITAL_COMMUNITY): Payer: Self-pay

## 2020-06-25 MED FILL — Glimepiride Tab 4 MG: ORAL | 90 days supply | Qty: 90 | Fill #0 | Status: AC

## 2020-07-03 ENCOUNTER — Other Ambulatory Visit (HOSPITAL_COMMUNITY): Payer: Self-pay

## 2020-07-04 ENCOUNTER — Other Ambulatory Visit (HOSPITAL_COMMUNITY): Payer: Self-pay

## 2020-07-06 ENCOUNTER — Other Ambulatory Visit (HOSPITAL_COMMUNITY): Payer: Self-pay

## 2020-07-06 ENCOUNTER — Telehealth: Payer: Self-pay

## 2020-07-06 NOTE — Telephone Encounter (Signed)
Dr Vickey Sages office sent a referral on pt for DM- we could not make out any information on the referral. Sent a fax to his office to resend referral. Waiting on that to make appt with patient.

## 2020-07-12 ENCOUNTER — Other Ambulatory Visit (HOSPITAL_COMMUNITY): Payer: Self-pay

## 2020-07-13 ENCOUNTER — Other Ambulatory Visit (HOSPITAL_COMMUNITY): Payer: Self-pay

## 2020-07-13 MED FILL — Losartan Potassium Tab 50 MG: ORAL | 90 days supply | Qty: 90 | Fill #0 | Status: AC

## 2020-07-16 ENCOUNTER — Encounter: Payer: Self-pay | Admitting: Nurse Practitioner

## 2020-07-16 ENCOUNTER — Ambulatory Visit: Payer: 59 | Admitting: Nurse Practitioner

## 2020-07-16 ENCOUNTER — Other Ambulatory Visit: Payer: Self-pay

## 2020-07-16 ENCOUNTER — Other Ambulatory Visit (HOSPITAL_COMMUNITY): Payer: Self-pay

## 2020-07-16 VITALS — BP 169/90 | HR 72 | Ht 67.0 in | Wt 214.4 lb

## 2020-07-16 DIAGNOSIS — E119 Type 2 diabetes mellitus without complications: Secondary | ICD-10-CM

## 2020-07-16 LAB — POCT GLYCOSYLATED HEMOGLOBIN (HGB A1C): HbA1c, POC (controlled diabetic range): 8.5 % — AB (ref 0.0–7.0)

## 2020-07-16 MED ORDER — GLIMEPIRIDE 2 MG PO TABS
2.0000 mg | ORAL_TABLET | Freq: Every day | ORAL | 3 refills | Status: DC
  Filled 2020-07-16: qty 90, 90d supply, fill #0
  Filled 2020-10-11: qty 90, 90d supply, fill #1
  Filled 2021-01-11: qty 90, 90d supply, fill #2
  Filled 2021-04-13: qty 90, 90d supply, fill #3

## 2020-07-16 MED ORDER — ACCU-CHEK GUIDE VI STRP
ORAL_STRIP | 12 refills | Status: DC
  Filled 2020-07-16: qty 100, 25d supply, fill #0

## 2020-07-16 NOTE — Progress Notes (Signed)
Endocrinology Consult Note       07/16/2020, 9:23 AM   Subjective:    Patient ID: Alex Taylor, male    DOB: 10/22/1960.  Alex Taylor is being seen in consultation for management of currently uncontrolled symptomatic diabetes requested by  Lemmie Evens, MD.   Past Medical History:  Diagnosis Date  . Diabetes mellitus without complication [facility])     Past Surgical History:  Procedure Laterality Date  . BACK SURGERY    . COLONOSCOPY  12/20/2011   Procedure: COLONOSCOPY;  Surgeon: Jamesetta So, MD;  Location: [facility];  Service: Gastroenterology;  Laterality: N/A;  . KNEE ARTHROSCOPY     left knee    Social History   Socioeconomic History  . Marital status: Married    Spouse name: Not on file  . Number of children: Not on file  . Years of education: Not on file  . Highest education level: Not on file  Occupational History  . Not on file  Tobacco Use  . Smoking status: Former Research scientist (life sciences)  . Smokeless tobacco: Never Used  Substance and Sexual Activity  . Alcohol use: Yes    Comment: occ  . Drug use: No  . Sexual activity: Not on file  Other Topics Concern  . Not on file  Social History Narrative  . Not on file   Social Determinants of Health   Financial Resource Strain: Not on file  Food Insecurity: Not on file  Transportation Needs: Not on file  Physical Activity: Not on file  Stress: Not on file  Social Connections: Not on file    Family History  Problem Relation Age of Onset  . Cancer Mother   . Osteoporosis Mother   . Hypertension Father   . Hyperlipidemia Father     Outpatient Encounter Medications as of 07/16/2020  Medication Sig  . glimepiride (AMARYL) 2 MG tablet Take 1 tablet (2 mg total) by mouth daily before breakfast.  . glucose blood (ACCU-CHEK GUIDE) test strip Use as instructed to monitor glucose 4 times daily  . bromocriptine (PARLODEL) 2.5 MG tablet Take 1  tablet (2.5 mg total) by mouth at bedtime. (Patient not taking: Reported on 09/15/2016)  . Diclofenac Sodium 3 % GEL Apply to shoulder tid (Patient not taking: Reported on 07/16/2020)  . HYDROcodone-acetaminophen (NORCO) 10-325 MG tablet Take 1 tablet up to 4 times a day for severe pain (Do not fil before 4.8.22)  . ibuprofen (ADVIL,MOTRIN) 200 MG tablet Take 200 mg by mouth every 6 (six) hours as needed. For pain  . losartan (COZAAR) 50 MG tablet TAKE 1 TABLET BY MOUTH EVERY DAY FOR BLOOD PRESSURE & PROTECTION OF THE KIDNEYS IN DIABETES  . lovastatin (MEVACOR) 10 MG tablet TAKE 1 TABLET BY MOUTH ONCE DAILY FOR HIGH CHOLESTEROL.  . metFORMIN (GLUCOPHAGE-XR) 500 MG 24 hr tablet TAKE 2 TABLETS BY MOUTH 2 TIMES DAILY  . [DISCONTINUED] glimepiride (AMARYL) 1 MG tablet  (Patient not taking: Reported on 07/16/2020)  . [DISCONTINUED] glimepiride (AMARYL) 4 MG tablet TAKE 1 TABLET BY MOUTH ONCE DAILY IN THE MORNING FOR DIABETES.  . [DISCONTINUED] HYDROcodone-acetaminophen (NORCO/VICODIN) 5-325 MG tablet Take 1 tablet by mouth every 4 (  four) hours as needed. Take with food  . [DISCONTINUED] losartan (COZAAR) 50 MG tablet   . [DISCONTINUED] losartan (COZAAR) 50 MG tablet TAKE 1 TABLET BY MOUTH ONCE DAILY FOR HIGH BLOOD PRESSURE AND PROTECTION OF THE KIDNEYS IN DIABETES.  . [DISCONTINUED] lovastatin (MEVACOR) 10 MG tablet Take 10 mg by mouth at bedtime.  . [DISCONTINUED] metFORMIN (GLUCOPHAGE) 500 MG tablet Take 500 mg by mouth 2 (two) times daily with a meal.  . [DISCONTINUED] metFORMIN (GLUCOPHAGE) 500 MG tablet TAKE 2 TABLETS BY MOUTH EVERY DAY FOR DIABETES  . [DISCONTINUED] metFORMIN (GLUCOPHAGE-XR) 500 MG 24 hr tablet TAKE 2 TABLETS BY MOUTH TWICE A DAY.   No facility-administered encounter medications on file as of 07/16/2020.    ALLERGIES: Allergies  Allergen Reactions  . Codeine Other (See Comments)    "makes me go stupid" per patient  . Penicillins Other (See Comments)    Childhood reaction     VACCINATION STATUS:  There is no immunization history on file for this patient.  Diabetes He presents for his initial diabetic visit. He has type 2 diabetes mellitus. His disease course has been improving. Hypoglycemia symptoms include nervousness/anxiousness, sweats and tremors. Associated symptoms include fatigue and polyuria. Pertinent negatives for diabetes include no weight loss. There are no hypoglycemic complications. Symptoms are stable. There are no diabetic complications. Risk factors for coronary artery disease include diabetes mellitus, dyslipidemia, family history, hypertension, male sex, obesity, tobacco exposure and sedentary lifestyle. Current diabetic treatment includes oral agent (dual therapy). He is compliant with treatment most of the time. His weight is fluctuating minimally. He is following a generally healthy diet. Meal planning includes avoidance of concentrated sweets. He has not had a previous visit with a dietitian. He participates in exercise intermittently. His breakfast blood glucose range is generally >200 mg/dl. His lunch blood glucose range is generally 140-180 mg/dl. His dinner blood glucose range is generally 140-180 mg/dl. (He presents today for his consultation, accompanied by his wife, with his logs showing above target fasting and near goal postprandial glycemic profile.  His POCT A1c today is 8.5%, improving from previous A1c of 10.8%.  He monitors glucose 3x daily routinely.  He drinks coffee with oat creamer and water only.  He eats 3 routine meals with an evening snack (sugar-free jello).  He is a very active gentleman throughout the day and has some hypoglycemia mid-day associated with it.) An ACE inhibitor/angiotensin II receptor blocker is being taken. He does not see a podiatrist.Eye exam is current.  Hyperlipidemia This is a chronic problem. The current episode started more than 1 year ago. The problem is uncontrolled. Recent lipid tests were reviewed and  are variable. Exacerbating diseases include diabetes and obesity. Factors aggravating his hyperlipidemia include fatty foods. Current antihyperlipidemic treatment includes statins. The current treatment provides moderate improvement of lipids. Compliance problems include adherence to diet and adherence to exercise.  Risk factors for coronary artery disease include diabetes mellitus, dyslipidemia, family history, male sex, obesity, hypertension and a sedentary lifestyle.  Hypertension This is a chronic problem. The current episode started more than 1 year ago. The problem has been waxing and waning since onset. The problem is uncontrolled. Associated symptoms include sweats. There are no associated agents to hypertension. Risk factors for coronary artery disease include diabetes mellitus, dyslipidemia, family history, obesity, male gender, sedentary lifestyle and smoking/tobacco exposure. Past treatments include angiotensin blockers. The current treatment provides mild improvement. Compliance problems include diet and exercise.      Review of systems  Constitutional: + Minimally fluctuating body weight, current Body mass index is 33.58 kg/m., + fatigue, no subjective hyperthermia, no subjective hypothermia Eyes: no blurry vision, no xerophthalmia ENT: no sore throat, no nodules palpated in throat, no dysphagia/odynophagia, no hoarseness Cardiovascular: no chest pain, no shortness of breath, no palpitations, no leg swelling Respiratory: no cough, no shortness of breath Gastrointestinal: no nausea/vomiting/diarrhea Genitourinary: + polyuria Musculoskeletal: + diffuse joint and back pain Skin: no rashes, no hyperemia Neurological: no tremors, no numbness, no tingling, no dizziness Psychiatric: no depression, no anxiety  Objective:     BP (!) 169/90   Pulse 72   Ht 5\' 7"  (1.702 m)   Wt 214 lb 6.4 oz (97.3 kg)   BMI 33.58 kg/m   Wt Readings from Last 3 Encounters:  07/16/20 214 lb 6.4 oz  (97.3 kg)  11/28/16 217 lb (98.4 kg)  09/15/16 220 lb (99.8 kg)     BP Readings from Last 3 Encounters:  07/16/20 (!) 169/90  11/28/16 131/82  09/15/16 (!) 166/109     Physical Exam- Limited  Constitutional:  Body mass index is 33.58 kg/m. , not in acute distress, normal state of mind Eyes:  EOMI, no exophthalmos Neck: Supple Cardiovascular: RRR, no murmurs, rubs, or gallops, no edema Respiratory: Adequate breathing efforts, no crackles, rales, rhonchi, or wheezing Musculoskeletal: no gross deformities, strength intact in all four extremities, no gross restriction of joint movements Skin:  no rashes, no hyperemia Neurological: no tremor with outstretched hands    CMP ( most recent) CMP     Component Value Date/Time   NA 136 07/29/2019 0945   K 4.3 07/29/2019 0945   CL 103 07/29/2019 0945   CO2 24 07/29/2019 0945   GLUCOSE 196 (H) 07/29/2019 0945   BUN 17 04/13/2020 0000   CREATININE 0.7 04/13/2020 0000   CREATININE 0.60 (L) 07/29/2019 0945   CALCIUM 8.6 (L) 07/29/2019 0945   GFRNONAA 103 04/13/2020 0000   GFRAA >60 07/29/2019 0945     Diabetic Labs (most recent): Lab Results  Component Value Date   HGBA1C 8.5 (A) 07/16/2020   HGBA1C 10.8 04/13/2020   HGBA1C 9.5 11/19/2019     Lipid Panel ( most recent) Lipid Panel     Component Value Date/Time   TRIG 156 10/31/2018 0000   LDLCALC 72 10/31/2018 0000      No results found for: TSH, FREET4         Assessment & Plan:   1) Uncontrolled Type 2 Diabetes without complication:  He presents today for his consultation, accompanied by his wife, with his logs showing above target fasting and near goal postprandial glycemic profile.  His POCT A1c today is 8.5%, improving from previous A1c of 10.8%.  He monitors glucose 3x daily routinely.  He drinks coffee with oat creamer and water only.  He eats 3 routine meals with an evening snack (sugar-free jello).  He is a very active gentleman throughout the day and has  some hypoglycemia mid-day associated with it.  - Alex Taylor has currently uncontrolled symptomatic type 2 DM since 61 years of age, with most recent A1c of 8.5 %.   -Recent labs reviewed.  - I had a long discussion with him about the progressive nature of diabetes and the pathology behind its complications. -his diabetes is not currently complicated but he remains at a high risk for more acute and chronic complications which include CAD, CVA, CKD, retinopathy, and neuropathy. These are all discussed in detail with him.  -  I have counseled him on diet and weight management by adopting a carbohydrate restricted/protein rich diet. Patient is encouraged to switch to unprocessed or minimally processed complex starch and increased protein intake (animal or plant source), fruits, and vegetables. -  he is advised to stick to a routine mealtimes to eat 3 meals a day and avoid unnecessary snacks (to snack only to correct hypoglycemia).   - he acknowledges that there is a room for improvement in his food and drink choices. - Suggestion is made for him to avoid simple carbohydrates from his diet including Cakes, Sweet Desserts, Ice Cream, Soda (diet and regular), Sweet Tea, Candies, Chips, Cookies, Store Bought Juices, Alcohol in Excess of 1-2 drinks a day, Artificial Sweeteners, Coffee Creamer, and "Sugar-free" Products. This will help patient to have more stable blood glucose profile and potentially avoid unintended weight gain.  - he will be scheduled with Jearld Fenton, RDN, CDE for diabetes education.  - I have approached him with the following individualized plan to manage his diabetes and patient agrees:    -he is encouraged to start monitoring glucose 4 times daily, before meals and before bed, to log their readings on the clinic sheets provided, and bring them to review at follow up appointment in 2 weeks.  Sample Accuchek guide meter provided from office today.  - Adjustment parameters are  given to him for hypo and hyperglycemia in writing. - he is encouraged to call clinic for blood glucose levels less than 70 or above 300 mg /dl. - he is advised to lower his dose of Metformin to 1000 mg ER daily with breakfast (instead of BID) and lower his dose of Glimepiride to 2 mg po daily with breakfast, therapeutically suitable for patient .  - he will be considered for incretin therapy as appropriate next visit.  - Specific targets for  A1c; LDL, HDL, and Triglycerides were discussed with the patient.  2) Blood Pressure /Hypertension:  his blood pressure is not controlled to target.   he is advised to continue his current medications including Losartan 50 mg p.o. daily with breakfast.  3) Lipids/Hyperlipidemia:    Review of his recent lipid panel from 10/31/18 showed controlled LDL at 72 and elevated triglycerides of 156 .  he is advised to continue Lovastatin 10 mg daily at bedtime.  Side effects and precautions discussed with him.  4)  Weight/Diet:  his Body mass index is 33.58 kg/m.  -  clearly complicating his diabetes care.   he is a candidate for weight loss. I discussed with him the fact that loss of 5 - 10% of his  current body weight will have the most impact on his diabetes management.  Exercise, and detailed carbohydrates information provided  -  detailed on discharge instructions.  5) Chronic Care/Health Maintenance: -he is on ACEI/ARB and Statin medications and is encouraged to initiate and continue to follow up with Ophthalmology, Dentist, Podiatrist at least yearly or according to recommendations, and advised to stay away from smoking. I have recommended yearly flu vaccine and pneumonia vaccine at least every 5 years; moderate intensity exercise for up to 150 minutes weekly; and sleep for at least 7 hours a day.  - he is advised to maintain close follow up with Lemmie Evens, MD for primary care needs, as well as his other providers for optimal and coordinated care.   -  Time spent in this patient care: 60 min, of which > 50% was spent in counseling him about his diabetes  and the rest reviewing his blood glucose logs, discussing his hypoglycemia and hyperglycemia episodes, reviewing his current and previous labs/studies (including abstraction from other facilities) and medications doses and developing a long term treatment plan based on the latest standards of care/guidelines; and documenting his care.    Please refer to Patient Instructions for Blood Glucose Monitoring and Insulin/Medications Dosing Guide" in media tab for additional information. Please also refer to "Patient Self Inventory" in the Media tab for reviewed elements of pertinent patient history.  Alex Taylor participated in the discussions, expressed understanding, and voiced agreement with the above plans.  All questions were answered to his satisfaction. he is encouraged to contact clinic should he have any questions or concerns prior to his return visit.     Follow up plan: - Return in about 2 weeks (around 07/30/2020) for Diabetes F/U, Bring meter and logs, ABI next visit.    Rayetta Pigg, [facility] [address] [place], [place] [address] Phone: [phone] Fax: [phone]  07/16/2020, 9:23 AM

## 2020-07-16 NOTE — Patient Instructions (Signed)
Diabetes Mellitus and Nutrition, Adult When you have diabetes, or diabetes mellitus, it is very important to have healthy eating habits because your blood sugar (glucose) levels are greatly affected by what you eat and drink. Eating healthy foods in the right amounts, at about the same times every day, can help you:  Control your blood glucose.  Lower your risk of heart disease.  Improve your blood pressure.  Reach or maintain a healthy weight. What can affect my meal plan? Every person with diabetes is different, and each person has different needs for a meal plan. Your health care provider may recommend that you work with a dietitian to make a meal plan that is best for you. Your meal plan may vary depending on factors such as:  The calories you need.  The medicines you take.  Your weight.  Your blood glucose, blood pressure, and cholesterol levels.  Your activity level.  Other health conditions you have, such as heart or kidney disease. How do carbohydrates affect me? Carbohydrates, also called carbs, affect your blood glucose level more than any other type of food. Eating carbs naturally raises the amount of glucose in your blood. Carb counting is a method for keeping track of how many carbs you eat. Counting carbs is important to keep your blood glucose at a healthy level, especially if you use insulin or take certain oral diabetes medicines. It is important to know how many carbs you can safely have in each meal. This is different for every person. Your dietitian can help you calculate how many carbs you should have at each meal and for each snack. How does alcohol affect me? Alcohol can cause a sudden decrease in blood glucose (hypoglycemia), especially if you use insulin or take certain oral diabetes medicines. Hypoglycemia can be a life-threatening condition. Symptoms of hypoglycemia, such as sleepiness, dizziness, and confusion, are similar to symptoms of having too much  alcohol.  Do not drink alcohol if: ? Your health care provider tells you not to drink. ? You are pregnant, may be pregnant, or are planning to become pregnant.  If you drink alcohol: ? Do not drink on an empty stomach. ? Limit how much you use to:  0-1 drink a day for women.  0-2 drinks a day for men. ? Be aware of how much alcohol is in your drink. In the [place] one drink equals one 12 oz bottle of beer (355 mL), one 5 oz glass of wine (148 mL), or one 1 oz glass of hard liquor (44 mL). ? Keep yourself hydrated with water, diet soda, or unsweetened iced tea.  Keep in mind that regular soda, juice, and other mixers may contain a lot of sugar and must be counted as carbs. What are tips for following this plan? Reading food labels  Start by checking the serving size on the "Nutrition Facts" label of packaged foods and drinks. The amount of calories, carbs, fats, and other nutrients listed on the label is based on one serving of the item. Many items contain more than one serving per package.  Check the total grams (g) of carbs in one serving. You can calculate the number of servings of carbs in one serving by dividing the total carbs by 15. For example, if a food has 30 g of total carbs per serving, it would be equal to 2 servings of carbs.  Check the number of grams (g) of saturated fats and trans fats in one serving. Choose foods that have   a low amount or none of these fats.  Check the number of milligrams (mg) of salt (sodium) in one serving. Most people should limit total sodium intake to less than 2,300 mg per day.  Always check the nutrition information of foods labeled as "low-fat" or "nonfat." These foods may be higher in added sugar or refined carbs and should be avoided.  Talk to your dietitian to identify your daily goals for nutrients listed on the label. Shopping  Avoid buying canned, pre-made, or processed foods. These foods tend to be high in fat, sodium, and added  sugar.  Shop around the outside edge of the grocery store. This is where you will most often find fresh fruits and vegetables, bulk grains, fresh meats, and fresh dairy. Cooking  Use low-heat cooking methods, such as baking, instead of high-heat cooking methods like deep frying.  Cook using healthy oils, such as olive, canola, or sunflower oil.  Avoid cooking with butter, cream, or high-fat meats. Meal planning  Eat meals and snacks regularly, preferably at the same times every day. Avoid going long periods of time without eating.  Eat foods that are high in fiber, such as fresh fruits, vegetables, beans, and whole grains. Talk with your dietitian about how many servings of carbs you can eat at each meal.  Eat 4-6 oz (112-168 g) of lean protein each day, such as lean meat, chicken, fish, eggs, or tofu. One ounce (oz) of lean protein is equal to: ? 1 oz (28 g) of meat, chicken, or fish. ? 1 egg. ?  cup (62 g) of tofu.  Eat some foods each day that contain healthy fats, such as avocado, nuts, seeds, and fish.   What foods should I eat? Fruits Berries. Apples. Oranges. Peaches. Apricots. Plums. Grapes. Mango. Papaya. Pomegranate. Kiwi. Cherries. Vegetables Lettuce. Spinach. Leafy greens, including kale, chard, collard greens, and mustard greens. Beets. Cauliflower. Cabbage. Broccoli. Carrots. Green beans. Tomatoes. Peppers. Onions. Cucumbers. Brussels sprouts. Grains Whole grains, such as whole-wheat or whole-grain bread, crackers, tortillas, cereal, and pasta. Unsweetened oatmeal. Quinoa. Brown or wild rice. Meats and other proteins Seafood. Poultry without skin. Lean cuts of poultry and beef. Tofu. Nuts. Seeds. Dairy Low-fat or fat-free dairy products such as milk, yogurt, and cheese. The items listed above may not be a complete list of foods and beverages you can eat. Contact a dietitian for more information. What foods should I avoid? Fruits Fruits canned with  syrup. Vegetables Canned vegetables. Frozen vegetables with butter or cream sauce. Grains Refined white flour and flour products such as bread, pasta, snack foods, and cereals. Avoid all processed foods. Meats and other proteins Fatty cuts of meat. Poultry with skin. Breaded or fried meats. Processed meat. Avoid saturated fats. Dairy Full-fat yogurt, cheese, or milk. Beverages Sweetened drinks, such as soda or iced tea. The items listed above may not be a complete list of foods and beverages you should avoid. Contact a dietitian for more information. Questions to ask a health care provider  Do I need to meet with a diabetes educator?  Do I need to meet with a dietitian?  What number can I call if I have questions?  When are the best times to check my blood glucose? Where to find more information:  American Diabetes Association: diabetes.org  Academy of Nutrition and Dietetics: www.eatright.org  National Institute of Diabetes and Digestive and Kidney Diseases: www.niddk.nih.gov  Association of Diabetes Care and Education Specialists: www.diabeteseducator.org Summary  It is important to have healthy eating   habits because your blood sugar (glucose) levels are greatly affected by what you eat and drink.  A healthy meal plan will help you control your blood glucose and maintain a healthy lifestyle.  Your health care provider may recommend that you work with a dietitian to make a meal plan that is best for you.  Keep in mind that carbohydrates (carbs) and alcohol have immediate effects on your blood glucose levels. It is important to count carbs and to use alcohol carefully. This information is not intended to replace advice given to you by your health care provider. Make sure you discuss any questions you have with your health care provider. Document Revised: 01/15/2019 Document Reviewed: 01/15/2019 Elsevier Patient Education  2021 [employer] Inc.  

## 2020-07-28 ENCOUNTER — Other Ambulatory Visit (HOSPITAL_COMMUNITY): Payer: Self-pay

## 2020-07-29 ENCOUNTER — Other Ambulatory Visit (HOSPITAL_COMMUNITY): Payer: Self-pay

## 2020-07-30 ENCOUNTER — Other Ambulatory Visit (HOSPITAL_COMMUNITY): Payer: Self-pay

## 2020-07-31 ENCOUNTER — Encounter: Payer: Self-pay | Admitting: Nurse Practitioner

## 2020-07-31 ENCOUNTER — Other Ambulatory Visit: Payer: Self-pay

## 2020-07-31 ENCOUNTER — Ambulatory Visit: Payer: 59 | Admitting: Nurse Practitioner

## 2020-07-31 ENCOUNTER — Other Ambulatory Visit (HOSPITAL_COMMUNITY): Payer: Self-pay

## 2020-07-31 VITALS — BP 138/79 | HR 85 | Ht 67.0 in | Wt 209.2 lb

## 2020-07-31 DIAGNOSIS — E119 Type 2 diabetes mellitus without complications: Secondary | ICD-10-CM

## 2020-07-31 MED ORDER — OZEMPIC (0.25 OR 0.5 MG/DOSE) 2 MG/1.5ML ~~LOC~~ SOPN
0.5000 mg | PEN_INJECTOR | SUBCUTANEOUS | 3 refills | Status: DC
  Filled 2020-07-31: qty 3, 56d supply, fill #0

## 2020-07-31 MED ORDER — ACCU-CHEK GUIDE VI STRP
ORAL_STRIP | 12 refills | Status: AC
  Filled 2020-07-31: qty 100, 90d supply, fill #0
  Filled 2020-07-31: qty 100, 50d supply, fill #0

## 2020-07-31 NOTE — Patient Instructions (Signed)

## 2020-07-31 NOTE — Progress Notes (Addendum)
Endocrinology Follow Up Note       07/31/2020, 9:17 AM   Subjective:    Patient ID: Alex Taylor, male    DOB: 03/19/1958.  Alex Taylor is being seen in follow up after being seen in consultation for management of currently uncontrolled symptomatic diabetes requested by  Lemmie Evens, MD.   Past Medical History:  Diagnosis Date   Diabetes mellitus without complication [facility])     Past Surgical History:  Procedure Laterality Date   BACK SURGERY     COLONOSCOPY  12/20/2011   Procedure: COLONOSCOPY;  Surgeon: Jamesetta So, MD;  Location: [facility];  Service: Gastroenterology;  Laterality: N/A;   KNEE ARTHROSCOPY     left knee    Social History   Socioeconomic History   Marital status: Married    Spouse name: Not on file   Number of children: Not on file   Years of education: Not on file   Highest education level: Not on file  Occupational History   Not on file  Tobacco Use   Smoking status: Former    Pack years: 0.00   Smokeless tobacco: Never  Substance and Sexual Activity   Alcohol use: Yes    Comment: occ   Drug use: No   Sexual activity: Not on file  Other Topics Concern   Not on file  Social History Narrative   Not on file   Social Determinants of Health   Financial Resource Strain: Not on file  Food Insecurity: Not on file  Transportation Needs: Not on file  Physical Activity: Not on file  Stress: Not on file  Social Connections: Not on file    Family History  Problem Relation Age of Onset   Cancer Mother    Osteoporosis Mother    Hypertension Father    Hyperlipidemia Father     Outpatient Encounter Medications as of 07/31/2020  Medication Sig   Semaglutide,0.25 or 0.5MG /DOS, (OZEMPIC, 0.25 OR 0.5 MG/DOSE,) 2 MG/1.5ML SOPN Inject 0.5 mg into the skin once a week.   bromocriptine (PARLODEL) 2.5 MG tablet Take 1 tablet (2.5 mg total) by mouth at bedtime. (Patient not  taking: Reported on 09/15/2016)   Diclofenac Sodium 3 % GEL Apply to shoulder tid (Patient not taking: Reported on 07/16/2020)   glimepiride (AMARYL) 2 MG tablet Take 1 tablet (2 mg total) by mouth daily before breakfast.   glucose blood (ACCU-CHEK GUIDE) test strip Use as instructed to monitor glucose 2 times daily   HYDROcodone-acetaminophen (NORCO) 10-325 MG tablet Take 1 tablet up to 4 times a day for severe pain (Do not fil before 4.8.22)   ibuprofen (ADVIL,MOTRIN) 200 MG tablet Take 200 mg by mouth every 6 (six) hours as needed. For pain   losartan (COZAAR) 50 MG tablet TAKE 1 TABLET BY MOUTH EVERY DAY FOR BLOOD PRESSURE & PROTECTION OF THE KIDNEYS IN DIABETES   lovastatin (MEVACOR) 10 MG tablet TAKE 1 TABLET BY MOUTH ONCE DAILY FOR HIGH CHOLESTEROL.   metFORMIN (GLUCOPHAGE-XR) 500 MG 24 hr tablet TAKE 2 TABLETS BY MOUTH 2 TIMES DAILY (Patient taking differently: Take by mouth daily with breakfast.)   [DISCONTINUED] glucose blood (ACCU-CHEK GUIDE) test  strip Use as instructed to monitor glucose 4 times daily   No facility-administered encounter medications on file as of 07/31/2020.    ALLERGIES: Allergies  Allergen Reactions   Codeine Other (See Comments)    "makes me go stupid" per patient   Penicillins Other (See Comments)    Childhood reaction    VACCINATION STATUS:  There is no immunization history on file for this patient.  Diabetes He presents for his follow-up diabetic visit. He has type 2 diabetes mellitus. His disease course has been stable. There are no hypoglycemic associated symptoms. Associated symptoms include fatigue, polyuria and weight loss. There are no hypoglycemic complications. Symptoms are stable. There are no diabetic complications. Risk factors for coronary artery disease include diabetes mellitus, dyslipidemia, family history, hypertension, male sex, obesity, tobacco exposure and sedentary lifestyle. Current diabetic treatment includes oral agent (dual  therapy). He is compliant with treatment most of the time. His weight is decreasing steadily. He is following a generally healthy diet. Meal planning includes avoidance of concentrated sweets. He has not had a previous visit with a dietitian. He participates in exercise intermittently. His home blood glucose trend is fluctuating minimally. His breakfast blood glucose range is generally >200 mg/dl. His lunch blood glucose range is generally 180-200 mg/dl. His dinner blood glucose range is generally 180-200 mg/dl. His bedtime blood glucose range is generally 180-200 mg/dl. (He presents today, with his wife on the phone, with his meter and logs showing above target fasting and postprandial glycemic profile overall.  He has lost weight since last visit and has worked on his diet.  He denies any hypoglycemia.) An ACE inhibitor/angiotensin II receptor blocker is being taken. He does not see a podiatrist.Eye exam is current.  Hyperlipidemia This is a chronic problem. The current episode started more than 1 year ago. The problem is uncontrolled. Recent lipid tests were reviewed and are variable. Exacerbating diseases include diabetes and obesity. Factors aggravating his hyperlipidemia include fatty foods. Current antihyperlipidemic treatment includes statins. The current treatment provides moderate improvement of lipids. Compliance problems include adherence to diet and adherence to exercise.  Risk factors for coronary artery disease include diabetes mellitus, dyslipidemia, family history, male sex, obesity, hypertension and a sedentary lifestyle.  Hypertension This is a chronic problem. The current episode started more than 1 year ago. The problem has been gradually improving since onset. The problem is controlled. There are no associated agents to hypertension. Risk factors for coronary artery disease include diabetes mellitus, dyslipidemia, family history, obesity, male gender, sedentary lifestyle and smoking/tobacco  exposure. Past treatments include angiotensin blockers. The current treatment provides mild improvement. Compliance problems include diet and exercise.     Review of systems  Constitutional: + Minimally fluctuating body weight, current Body mass index is 32.77 kg/m., + fatigue, no subjective hyperthermia, no subjective hypothermia Eyes: no blurry vision, no xerophthalmia ENT: no sore throat, no nodules palpated in throat, no dysphagia/odynophagia, no hoarseness Cardiovascular: no chest pain, no shortness of breath, no palpitations, no leg swelling Respiratory: no cough, no shortness of breath Gastrointestinal: no nausea/vomiting/diarrhea Genitourinary: + polyuria-improved Musculoskeletal: + diffuse joint and back pain- drastically improved Skin: no rashes, no hyperemia Neurological: no tremors, no numbness, no tingling, no dizziness Psychiatric: no depression, no anxiety  Objective:     BP 138/79   Pulse 85   Ht 5\' 7"  (1.702 m)   Wt 209 lb 3.2 oz (94.9 kg)   BMI 32.77 kg/m   Wt Readings from Last 3 Encounters:  07/31/20 209  lb 3.2 oz (94.9 kg)  07/16/20 214 lb 6.4 oz (97.3 kg)  11/28/16 217 lb (98.4 kg)     BP Readings from Last 3 Encounters:  07/31/20 138/79  07/16/20 (!) 169/90  11/28/16 131/82      Physical Exam- Limited  Constitutional:  Body mass index is 32.77 kg/m. , not in acute distress, normal state of mind Eyes:  EOMI, no exophthalmos Neck: Supple Cardiovascular: RRR, no murmurs, rubs, or gallops, no edema Respiratory: Adequate breathing efforts, no crackles, rales, rhonchi, or wheezing Musculoskeletal: no gross deformities, strength intact in all four extremities, no gross restriction of joint movements Skin:  no rashes, no hyperemia Neurological: no tremor with outstretched hands    CMP ( most recent) CMP     Component Value Date/Time   NA 136 07/29/2019 0945   K 4.3 07/29/2019 0945   CL 103 07/29/2019 0945   CO2 24 07/29/2019 0945   GLUCOSE  196 (H) 07/29/2019 0945   BUN 17 04/13/2020 0000   CREATININE 0.7 04/13/2020 0000   CREATININE 0.60 (L) 07/29/2019 0945   CALCIUM 8.6 (L) 07/29/2019 0945   GFRNONAA 103 04/13/2020 0000   GFRAA >60 07/29/2019 0945     Diabetic Labs (most recent): Lab Results  Component Value Date   HGBA1C 8.5 (A) 07/16/2020   HGBA1C 10.8 04/13/2020   HGBA1C 9.5 11/19/2019     Lipid Panel ( most recent) Lipid Panel     Component Value Date/Time   TRIG 156 10/31/2018 0000   LDLCALC 72 10/31/2018 0000      No results found for: TSH, FREET4         Assessment & Plan:   1) Uncontrolled Type 2 Diabetes without complication:  He presents today, with his wife on the phone, with his meter and logs showing above target fasting and postprandial glycemic profile overall.  He has lost weight since last visit and has worked on his diet.  He denies any hypoglycemia.  - JEURY MCNAB has currently uncontrolled symptomatic type 2 DM since 60 years of age, with most recent A1c of 8.5 %.   -Recent labs reviewed.  - I had a long discussion with him about the progressive nature of diabetes and the pathology behind its complications. -his diabetes is not currently complicated but he remains at a high risk for more acute and chronic complications which include CAD, CVA, CKD, retinopathy, and neuropathy. These are all discussed in detail with him.  - Nutritional counseling repeated at each appointment due to patients tendency to fall back in to old habits.  - The patient admits there is a room for improvement in their diet and drink choices. -  Suggestion is made for the patient to avoid simple carbohydrates from their diet including Cakes, Sweet Desserts / Pastries, Ice Cream, Soda (diet and regular), Sweet Tea, Candies, Chips, Cookies, Sweet Pastries, Store Bought Juices, Alcohol in Excess of 1-2 drinks a day, Artificial Sweeteners, Coffee Creamer, and "Sugar-free" Products. This will help patient to have  stable blood glucose profile and potentially avoid unintended weight gain.   - I encouraged the patient to switch to unprocessed or minimally processed complex starch and increased protein intake (animal or plant source), fruits, and vegetables.   - Patient is advised to stick to a routine mealtimes to eat 3 meals a day and avoid unnecessary snacks (to snack only to correct hypoglycemia).  - I have approached him with the following individualized plan to manage his diabetes and patient  agrees:   -Based on his above target glycemic profile, I discussed and initiated Ozempic 0.25 mg SQ weekly x 2 weeks then advance to 0.5 mg SQ weekly thereafter if tolerated well.  Samples provided from office today.  He demonstrated his ability to properly inject himself in the room with me today.  He can continue his Metformin 1000 mg ER daily with breakfast and Glimepiride 2 mg po daily with breakfast.    -he is encouraged to continue monitoring blood glucose twice daily, before breakfast and before bed, and to call the clinic if he has readings less than 70 or greater than 300 for 3 tests in a row.   - Adjustment parameters are given to him for hypo and hyperglycemia in writing.  - Specific targets for  A1c; LDL, HDL, and Triglycerides were discussed with the patient.  2) Blood Pressure /Hypertension:  his blood pressure is controlled to target.   he is advised to continue his current medications including Losartan 50 mg p.o. daily with breakfast.  3) Lipids/Hyperlipidemia:    Review of his recent lipid panel from 10/31/18 showed controlled LDL at 72 and elevated triglycerides of 156 .  he is advised to continue Lovastatin 10 mg daily at bedtime.  Side effects and precautions discussed with him.  4)  Weight/Diet:  his Body mass index is 32.77 kg/m.  -  clearly complicating his diabetes care.   he is a candidate for weight loss-recently lost 5 lbs since last visit- a good development for him. I discussed  with him the fact that loss of 5 - 10% of his  current body weight will have the most impact on his diabetes management.  Exercise, and detailed carbohydrates information provided  -  detailed on discharge instructions.  5) Chronic Care/Health Maintenance: -he is on ACEI/ARB and Statin medications and is encouraged to initiate and continue to follow up with Ophthalmology, Dentist, Podiatrist at least yearly or according to recommendations, and advised to stay away from smoking. I have recommended yearly flu vaccine and pneumonia vaccine at least every 5 years; moderate intensity exercise for up to 150 minutes weekly; and sleep for at least 7 hours a day.  - he is advised to maintain close follow up with Lemmie Evens, MD for primary care needs, as well as his other providers for optimal and coordinated care.     I spent 40 minutes in the care of the patient today including review of labs from [facility], Lipids, Thyroid Function, Hematology (current and previous including abstractions from other facilities); face-to-face time discussing  his blood glucose readings/logs, discussing hypoglycemia and hyperglycemia episodes and symptoms, medications doses, his options of short and long term treatment based on the latest standards of care / guidelines;  discussion about incorporating lifestyle medicine;  and documenting the encounter.    Please refer to Patient Instructions for Blood Glucose Monitoring and Insulin/Medications Dosing Guide"  in media tab for additional information. Please  also refer to " Patient Self Inventory" in the Media  tab for reviewed elements of pertinent patient history.  Alex Taylor participated in the discussions, expressed understanding, and voiced agreement with the above plans.  All questions were answered to his satisfaction. he is encouraged to contact clinic should he have any questions or concerns prior to his return visit.     Follow up plan: - Return in about 3 months  (around 10/31/2020) for Diabetes F/U- A1c and UM in office, No previsit labs, Bring meter and logs.  Rayetta Pigg, [facility] [address] [place], [place] [address] Phone: [phone] Fax: [phone]  07/31/2020, 9:17 AM

## 2020-08-03 ENCOUNTER — Other Ambulatory Visit (HOSPITAL_COMMUNITY): Payer: Self-pay

## 2020-08-05 ENCOUNTER — Telehealth: Payer: Self-pay

## 2020-08-05 ENCOUNTER — Other Ambulatory Visit (HOSPITAL_COMMUNITY): Payer: Self-pay

## 2020-08-05 NOTE — Telephone Encounter (Signed)
Discussed with pt's wife, understanding voiced. 

## 2020-08-05 NOTE — Telephone Encounter (Signed)
Patient's wife,(Gloria) called and said that on Sunday night he started having diarrhea and vomiting. He is still having diarrhea but no vomiting. She is asking if this is a side effect from his Ozempic. Please Advise. She can be reached back at [phone]

## 2020-08-06 LAB — HM DIABETES EYE EXAM

## 2020-08-18 ENCOUNTER — Other Ambulatory Visit (HOSPITAL_COMMUNITY): Payer: Self-pay

## 2020-08-18 DIAGNOSIS — M25562 Pain in left knee: Secondary | ICD-10-CM | POA: Diagnosis not present

## 2020-08-18 DIAGNOSIS — Z79891 Long term (current) use of opiate analgesic: Secondary | ICD-10-CM | POA: Diagnosis not present

## 2020-08-18 DIAGNOSIS — E1165 Type 2 diabetes mellitus with hyperglycemia: Secondary | ICD-10-CM | POA: Diagnosis not present

## 2020-08-18 DIAGNOSIS — G5791 Unspecified mononeuropathy of right lower limb: Secondary | ICD-10-CM | POA: Diagnosis not present

## 2020-08-18 MED FILL — Metformin HCl Tab ER 24HR 500 MG: ORAL | 90 days supply | Qty: 360 | Fill #0 | Status: AC

## 2020-08-25 ENCOUNTER — Other Ambulatory Visit (HOSPITAL_COMMUNITY): Payer: Self-pay

## 2020-08-31 ENCOUNTER — Other Ambulatory Visit: Payer: Self-pay

## 2020-09-19 ENCOUNTER — Other Ambulatory Visit (HOSPITAL_COMMUNITY): Payer: Self-pay

## 2020-09-19 MED FILL — Lovastatin Tab 10 MG: ORAL | 90 days supply | Qty: 90 | Fill #1 | Status: AC

## 2020-10-11 MED FILL — Losartan Potassium Tab 50 MG: ORAL | 90 days supply | Qty: 90 | Fill #1 | Status: CN

## 2020-10-12 ENCOUNTER — Other Ambulatory Visit (HOSPITAL_COMMUNITY): Payer: Self-pay

## 2020-10-15 ENCOUNTER — Other Ambulatory Visit (HOSPITAL_COMMUNITY): Payer: Self-pay

## 2020-10-15 MED FILL — Losartan Potassium Tab 50 MG: ORAL | 90 days supply | Qty: 90 | Fill #1 | Status: AC

## 2020-11-03 ENCOUNTER — Other Ambulatory Visit (HOSPITAL_COMMUNITY): Payer: Self-pay

## 2020-11-03 ENCOUNTER — Ambulatory Visit: Payer: 59 | Admitting: Nurse Practitioner

## 2020-11-03 ENCOUNTER — Other Ambulatory Visit: Payer: Self-pay

## 2020-11-03 ENCOUNTER — Encounter: Payer: Self-pay | Admitting: Nurse Practitioner

## 2020-11-03 VITALS — BP 134/85 | HR 81 | Ht 67.0 in | Wt 205.0 lb

## 2020-11-03 DIAGNOSIS — E119 Type 2 diabetes mellitus without complications: Secondary | ICD-10-CM

## 2020-11-03 DIAGNOSIS — E782 Mixed hyperlipidemia: Secondary | ICD-10-CM | POA: Diagnosis not present

## 2020-11-03 LAB — POCT GLYCOSYLATED HEMOGLOBIN (HGB A1C): Hemoglobin A1C: 7.8 % — AB (ref 4.0–5.6)

## 2020-11-03 MED ORDER — SEMAGLUTIDE (1 MG/DOSE) 4 MG/3ML ~~LOC~~ SOPN
1.0000 mg | PEN_INJECTOR | SUBCUTANEOUS | 3 refills | Status: DC
  Filled 2020-11-03: qty 6, 56d supply, fill #0

## 2020-11-03 NOTE — Addendum Note (Signed)
Addended by: Anibal Henderson on: 11/03/2020 09:50 AM   Modules accepted: Orders

## 2020-11-03 NOTE — Patient Instructions (Signed)

## 2020-11-03 NOTE — Progress Notes (Signed)
Endocrinology Follow Up Note       11/03/2020, 9:41 AM   Subjective:    Patient ID: Alex Taylor, male    DOB: [birth year].  Alex Taylor is being seen in follow up after being seen in consultation for management of currently uncontrolled symptomatic diabetes requested by  Lemmie Evens, MD.   Past Medical History:  Diagnosis Date   Diabetes mellitus without complication [facility])     Past Surgical History:  Procedure Laterality Date   BACK SURGERY     COLONOSCOPY  12/20/2011   Procedure: COLONOSCOPY;  Surgeon: Jamesetta So, MD;  Location: [facility];  Service: Gastroenterology;  Laterality: N/A;   KNEE ARTHROSCOPY     left knee    Social History   Socioeconomic History   Marital status: Married    Spouse name: Not on file   Number of children: Not on file   Years of education: Not on file   Highest education level: Not on file  Occupational History   Not on file  Tobacco Use   Smoking status: Former   Smokeless tobacco: Never  Vaping Use   Vaping Use: Never used  Substance and Sexual Activity   Alcohol use: Yes    Comment: occ   Drug use: No   Sexual activity: Not on file  Other Topics Concern   Not on file  Social History Narrative   Not on file   Social Determinants of Health   Financial Resource Strain: Not on file  Food Insecurity: Not on file  Transportation Needs: Not on file  Physical Activity: Not on file  Stress: Not on file  Social Connections: Not on file    Family History  Problem Relation Age of Onset   Cancer Mother    Osteoporosis Mother    Hypertension Father    Hyperlipidemia Father     Outpatient Encounter Medications as of 11/03/2020  Medication Sig   glimepiride (AMARYL) 2 MG tablet Take 1 tablet (2 mg total) by mouth daily before breakfast.   glucose blood (ACCU-CHEK GUIDE) test strip Use as instructed to monitor glucose 2 times daily    HYDROcodone-acetaminophen (NORCO) 10-325 MG tablet Take 1 tablet up to 4 times a day for severe pain (Do not fil before 4.8.22)   ibuprofen (ADVIL,MOTRIN) 200 MG tablet Take 200 mg by mouth every 6 (six) hours as needed. For pain   losartan (COZAAR) 50 MG tablet TAKE 1 TABLET BY MOUTH EVERY DAY FOR BLOOD PRESSURE & PROTECTION OF THE KIDNEYS IN DIABETES   lovastatin (MEVACOR) 10 MG tablet TAKE 1 TABLET BY MOUTH ONCE DAILY FOR HIGH CHOLESTEROL.   metFORMIN (GLUCOPHAGE-XR) 500 MG 24 hr tablet TAKE 2 TABLETS BY MOUTH 2 TIMES DAILY (Patient taking differently: Take by mouth daily with breakfast.)   Semaglutide, 1 MG/DOSE, 4 MG/3ML SOPN Inject 1 mg as directed once a week.   [DISCONTINUED] Semaglutide,0.25 or 0.'5MG'$ /DOS, (OZEMPIC, 0.25 OR 0.5 MG/DOSE,) 2 MG/1.5ML SOPN Inject 0.5 mg into the skin once a week.   [DISCONTINUED] bromocriptine (PARLODEL) 2.5 MG tablet Take 1 tablet (2.5 mg total) by mouth at bedtime. (Patient not taking: No sig reported)   [DISCONTINUED] Diclofenac  Sodium 3 % GEL Apply to shoulder tid (Patient not taking: No sig reported)   No facility-administered encounter medications on file as of 11/03/2020.    ALLERGIES: Allergies  Allergen Reactions   Codeine Other (See Comments)    "makes me go stupid" per patient   Penicillins Other (See Comments)    Childhood reaction    VACCINATION STATUS:  There is no immunization history on file for this patient.  Hyperlipidemia This is a chronic problem. The current episode started more than 1 year ago. The symptoms are aggravated by fatty foods. He has tried statins for the symptoms. The treatment provided moderate relief.  Hypertension This is a chronic problem. The current episode started more than 1 year ago. The problem has been gradually improving. He has tried angiotensin blockers for the symptoms. The treatment provided mild relief.  Diabetes He presents for his follow-up diabetic visit. He has type 2 diabetes mellitus. Onset  time: diagnosed at approx age of 53. His disease course has been improving. There are no hypoglycemic associated symptoms. Associated symptoms include weight loss. There are no hypoglycemic complications. Symptoms are stable. There are no diabetic complications. Risk factors for coronary artery disease include diabetes mellitus, dyslipidemia, male sex, obesity and hypertension. Current diabetic treatment includes oral agent (dual therapy). He is compliant with treatment most of the time. His weight is decreasing steadily. He is following a generally healthy diet. When asked about meal planning, he reported none. He has not had a previous visit with a dietitian. He participates in exercise intermittently. His home blood glucose trend is decreasing steadily. His breakfast blood glucose range is generally 180-200 mg/dl. His bedtime blood glucose range is generally 140-180 mg/dl. (He presents today with his meter and logs showing above target fasting and at goal postprandial glycemic profile.  His POCT A1c today is 7.8%, improving from last visit of 8.5%.  He denies any significant hypoglycemia.  He still struggles with meal timing as his work schedule varies.) An ACE inhibitor/angiotensin II receptor blocker is being taken. He does not see a podiatrist.Eye exam is current.    Review of systems  Constitutional: + steadily decreasing body weight,  current Body mass index is 32.11 kg/m. , no fatigue, no subjective hyperthermia, no subjective hypothermia Eyes: no blurry vision, no xerophthalmia ENT: no sore throat, no nodules palpated in throat, no dysphagia/odynophagia, no hoarseness Cardiovascular: no chest pain, no shortness of breath, no palpitations, no leg swelling Respiratory: no cough, no shortness of breath Gastrointestinal: no nausea/vomiting/diarrhea Musculoskeletal: no muscle/joint aches Skin: no rashes, no hyperemia Neurological: no tremors, no numbness, no tingling, no dizziness Psychiatric: no  depression, no anxiety  Objective:     BP 134/85   Pulse 81   Ht '5\' 7"'$  (1.702 m)   Wt 205 lb (93 kg)   BMI 32.11 kg/m   Wt Readings from Last 3 Encounters:  11/03/20 205 lb (93 kg)  07/31/20 209 lb 3.2 oz (94.9 kg)  07/16/20 214 lb 6.4 oz (97.3 kg)     BP Readings from Last 3 Encounters:  11/03/20 134/85  07/31/20 138/79  07/16/20 (!) 169/90     Physical Exam- Limited  Constitutional:  Body mass index is 32.11 kg/m. , not in acute distress, normal state of mind Eyes:  EOMI, no exophthalmos Cardiovascular: RRR, no murmurs, rubs, or gallops, no edema Respiratory: Adequate breathing efforts, no crackles, rales, rhonchi, or wheezing Musculoskeletal: no gross deformities, strength intact in all four extremities, no gross restriction of joint  movements Skin:  no rashes, no hyperemia Neurological: no tremor with outstretched hands   POCT ABI Results 11/03/20   Right ABI:  1.32      Left ABI:  1.34  Right leg systolic / diastolic: 123456 mmHg Left leg systolic / diastolic: 123456 mmHg  Arm systolic / diastolic: [id number] mmHG  Detailed report will be scanned into patient chart.   Diabetic Foot Exam - Simple   Simple Foot Form Diabetic Foot exam was performed with the following findings: Yes 11/03/2020  9:42 AM  Visual Inspection No deformities, no ulcerations, no other skin breakdown bilaterally: Yes Sensation Testing Intact to touch and monofilament testing bilaterally: Yes Pulse Check Posterior Tibialis and Dorsalis pulse intact bilaterally: Yes Comments      CMP ( most recent) CMP     Component Value Date/Time   NA 136 07/29/2019 0945   K 4.3 07/29/2019 0945   CL 103 07/29/2019 0945   CO2 24 07/29/2019 0945   GLUCOSE 196 (H) 07/29/2019 0945   BUN 17 04/13/2020 0000   CREATININE 0.7 04/13/2020 0000   CREATININE 0.60 (L) 07/29/2019 0945   CALCIUM 8.6 (L) 07/29/2019 0945   GFRNONAA 103 04/13/2020 0000   GFRAA >60 07/29/2019 0945     Diabetic Labs  (most recent): Lab Results  Component Value Date   HGBA1C 8.5 (A) 07/16/2020   HGBA1C 10.8 04/13/2020   HGBA1C 9.5 11/19/2019     Lipid Panel ( most recent) Lipid Panel     Component Value Date/Time   TRIG 156 10/31/2018 0000   LDLCALC 72 10/31/2018 0000      No results found for: TSH, FREET4         Assessment & Plan:   1) Uncontrolled Type 2 Diabetes without complication:  He presents today with his meter and logs showing above target fasting and at goal postprandial glycemic profile.  His POCT A1c today is 7.8%, improving from last visit of 8.5%.  He denies any significant hypoglycemia.  He still struggles with meal timing as his work schedule varies.  - Alex Taylor has currently uncontrolled symptomatic type 2 DM since 60 years of age.  -Recent labs reviewed.  - I had a long discussion with him about the progressive nature of diabetes and the pathology behind its complications. -his diabetes is not currently complicated but he remains at a high risk for more acute and chronic complications which include CAD, CVA, CKD, retinopathy, and neuropathy. These are all discussed in detail with him.  - Nutritional counseling repeated at each appointment due to patients tendency to fall back in to old habits.  - The patient admits there is a room for improvement in their diet and drink choices. -  Suggestion is made for the patient to avoid simple carbohydrates from their diet including Cakes, Sweet Desserts / Pastries, Ice Cream, Soda (diet and regular), Sweet Tea, Candies, Chips, Cookies, Sweet Pastries, Store Bought Juices, Alcohol in Excess of 1-2 drinks a day, Artificial Sweeteners, Coffee Creamer, and "Sugar-free" Products. This will help patient to have stable blood glucose profile and potentially avoid unintended weight gain.   - I encouraged the patient to switch to unprocessed or minimally processed complex starch and increased protein intake (animal or plant source),  fruits, and vegetables.   - Patient is advised to stick to a routine mealtimes to eat 3 meals a day and avoid unnecessary snacks (to snack only to correct hypoglycemia).  - I have approached him with the following individualized  plan to manage his diabetes and patient agrees:   -He has tolerated the Ozempic addition well, will increase to 1 mg SQ weekly after he finishes his current 0.5 mg supply.  He can also continue His Metformin 1000 mg ER daily with breakfast and Glimepiride 2 mg po daily.   -he is encouraged to continue monitoring blood glucose twice daily, before breakfast and before bed, and to call the clinic if he has readings less than 70 or greater than 300 for 3 tests in a row.   - Adjustment parameters are given to him for hypo and hyperglycemia in writing.  - Specific targets for  A1c; LDL, HDL, and Triglycerides were discussed with the patient.  2) Blood Pressure /Hypertension:  his blood pressure is controlled to target.   he is advised to continue his current medications including Losartan 50 mg p.o. daily with breakfast.  3) Lipids/Hyperlipidemia:    Review of his recent lipid panel from 10/31/18 showed controlled LDL at 72 and elevated triglycerides of 156 .  he is advised to continue Lovastatin 10 mg daily at bedtime.  Side effects and precautions discussed with him.  4)  Weight/Diet:  his Body mass index is 32.11 kg/m.  -  clearly complicating his diabetes care.   he is a candidate for weight loss-recently lost 5 lbs since last visit- a good development for him. I discussed with him the fact that loss of 5 - 10% of his  current body weight will have the most impact on his diabetes management.  Exercise, and detailed carbohydrates information provided  -  detailed on discharge instructions.  5) Chronic Care/Health Maintenance: -he is on ACEI/ARB and Statin medications and is encouraged to initiate and continue to follow up with Ophthalmology, Dentist, Podiatrist at least  yearly or according to recommendations, and advised to stay away from smoking. I have recommended yearly flu vaccine and pneumonia vaccine at least every 5 years; moderate intensity exercise for up to 150 minutes weekly; and sleep for at least 7 hours a day.  - he is advised to maintain close follow up with Lemmie Evens, MD for primary care needs, as well as his other providers for optimal and coordinated care.       I spent 40 minutes in the care of the patient today including review of labs from [place], Lipids, Thyroid Function, Hematology (current and previous including abstractions from other facilities); face-to-face time discussing  his blood glucose readings/logs, discussing hypoglycemia and hyperglycemia episodes and symptoms, medications doses, his options of short and long term treatment based on the latest standards of care / guidelines;  discussion about incorporating lifestyle medicine;  and documenting the encounter.    Please refer to Patient Instructions for Blood Glucose Monitoring and Insulin/Medications Dosing Guide"  in media tab for additional information. Please  also refer to " Patient Self Inventory" in the Media  tab for reviewed elements of pertinent patient history.  Alex Taylor participated in the discussions, expressed understanding, and voiced agreement with the above plans.  All questions were answered to his satisfaction. he is encouraged to contact clinic should he have any questions or concerns prior to his return visit.     Follow up plan: - Return in about 3 months (around 02/02/2021) for Diabetes F/U with A1c in office, Previsit labs, Bring meter and logs.    Rayetta Pigg, [facility] [address] [place], [place] [address] Phone: [phone] Fax: [phone]  11/03/2020, 9:41 AM

## 2020-11-16 ENCOUNTER — Telehealth: Payer: Self-pay | Admitting: Nurse Practitioner

## 2020-11-16 NOTE — Telephone Encounter (Signed)
Pt wife is calling and states that he is having spells of upset stomach, nausea and throwing up. Concerns that it possibly could be from the ozempic? Also is not sure if it could be from that since its not all the time. Unsure if he should see gastro or switch medicines. [phone]

## 2020-11-16 NOTE — Telephone Encounter (Signed)
Called the number provided by wife and left a detailed voice message for her to call back to discuss symptoms.  Called the home number provided and left a detailed voice message for someone to call back to discuss.

## 2020-12-18 ENCOUNTER — Telehealth: Payer: Self-pay | Admitting: Nurse Practitioner

## 2020-12-18 NOTE — Telephone Encounter (Signed)
Wife left vm and requested a call back in regards to patient stomach being upset and she believes it is due to the injection he is taking. Attempted to call wife back, no answer.

## 2020-12-18 NOTE — Telephone Encounter (Signed)
Have him try taking the Metformin 1000 mg ER before bed and see if it helps relieve some of the nausea.  If it doesn't, then it is likely the increase in Ozempic that is causing it.  Also remind him to eat lower fat foods and smaller meals because that can also help.

## 2020-12-18 NOTE — Telephone Encounter (Signed)
Patients wife said that she does not know if it is the Ozempic and Metformin, but. he has been vomiting since yesterday and he took the shot Tuesday morning. He did throw up the metformin yesterday morning. Please Advise. [phone]

## 2020-12-18 NOTE — Telephone Encounter (Signed)
Called patients wife back and left a detailed voice message on her voice mail with message from [place].

## 2020-12-18 NOTE — Telephone Encounter (Signed)
Can you call this patient's wife and advise of this, please?

## 2020-12-20 ENCOUNTER — Other Ambulatory Visit (HOSPITAL_COMMUNITY): Payer: Self-pay

## 2020-12-22 ENCOUNTER — Other Ambulatory Visit (HOSPITAL_COMMUNITY): Payer: Self-pay

## 2020-12-22 MED ORDER — LOVASTATIN 10 MG PO TABS
ORAL_TABLET | ORAL | 0 refills | Status: DC
  Filled 2020-12-22: qty 60, 60d supply, fill #0

## 2020-12-22 NOTE — Telephone Encounter (Signed)
Called patient and gave him the message from [place]. Patient stated that he will try the 0.5 mg and let us know how he does with that. Patient verbalized an understanding.

## 2020-12-22 NOTE — Telephone Encounter (Signed)
Noted, likely due to the increase in Ozempic.  We have 2 options: stop the Ozempic altogether, or reduce the dose back to 0.5 mg which he seemed to tolerate well.  There is a way to use his current 1 mg pens and get the 0.5 mg dose (he will dial it up to 27 clicks).

## 2020-12-22 NOTE — Telephone Encounter (Signed)
Patient's wife called and said he is still have nauseous and really bad gas. He told her he was not taking his shot this morning. Please advise. Call back at [phone]

## 2021-01-05 NOTE — Telephone Encounter (Signed)
Patient's wife called and stated that he wants to see you tomorrow. He has stopped his ozempic for the last 2 weeks and feels so much better. He was still having issues with staying in the bathroom. I am awaiting a call back from his wife to schedule

## 2021-01-11 ENCOUNTER — Other Ambulatory Visit (HOSPITAL_COMMUNITY): Payer: Self-pay

## 2021-01-11 MED FILL — Losartan Potassium Tab 50 MG: ORAL | 90 days supply | Qty: 90 | Fill #2 | Status: AC

## 2021-01-15 ENCOUNTER — Other Ambulatory Visit (HOSPITAL_COMMUNITY): Payer: Self-pay

## 2021-01-27 DIAGNOSIS — E119 Type 2 diabetes mellitus without complications: Secondary | ICD-10-CM | POA: Diagnosis not present

## 2021-01-28 LAB — COMPREHENSIVE METABOLIC PANEL
ALT: 22 IU/L (ref 0–44)
AST: 17 IU/L (ref 0–40)
Albumin/Globulin Ratio: 2 (ref 1.2–2.2)
Albumin: 4.7 g/dL (ref 3.8–4.9)
Alkaline Phosphatase: 52 IU/L (ref 44–121)
BUN/Creatinine Ratio: 18 (ref 10–24)
BUN: 14 mg/dL (ref 8–27)
Bilirubin Total: 0.4 mg/dL (ref 0.0–1.2)
CO2: 25 mmol/L (ref 20–29)
Calcium: 9.2 mg/dL (ref 8.6–10.2)
Chloride: 100 mmol/L (ref 96–106)
Creatinine, Ser: 0.77 mg/dL (ref 0.76–1.27)
Globulin, Total: 2.4 g/dL (ref 1.5–4.5)
Glucose: 207 mg/dL — ABNORMAL HIGH (ref 70–99)
Potassium: 5.1 mmol/L (ref 3.5–5.2)
Sodium: 138 mmol/L (ref 134–144)
Total Protein: 7.1 g/dL (ref 6.0–8.5)
eGFR: 102 mL/min/{1.73_m2} (ref >59)

## 2021-01-28 LAB — TSH: TSH: 1.28 u[IU]/mL (ref 0.450–4.500)

## 2021-01-28 LAB — T4, FREE: Free T4: 0.93 ng/dL (ref 0.82–1.77)

## 2021-01-28 LAB — LIPID PANEL
Chol/HDL Ratio: 2.8 ratio (ref 0.0–5.0)
Cholesterol, Total: 161 mg/dL (ref 100–199)
HDL: 57 mg/dL (ref >39)
LDL Chol Calc (NIH): 80 mg/dL (ref 0–99)
Triglycerides: 137 mg/dL (ref 0–149)
VLDL Cholesterol Cal: 24 mg/dL (ref 5–40)

## 2021-01-28 LAB — VITAMIN D 25 HYDROXY (VIT D DEFICIENCY, FRACTURES): Vit D, 25-Hydroxy: 36.9 ng/mL (ref 30.0–100.0)

## 2021-02-02 ENCOUNTER — Ambulatory Visit: Payer: 59 | Admitting: Nurse Practitioner

## 2021-02-02 ENCOUNTER — Other Ambulatory Visit: Payer: Self-pay

## 2021-02-02 ENCOUNTER — Other Ambulatory Visit (HOSPITAL_COMMUNITY): Payer: Self-pay

## 2021-02-02 ENCOUNTER — Encounter: Payer: Self-pay | Admitting: Nurse Practitioner

## 2021-02-02 VITALS — BP 153/77 | HR 86 | Ht 67.0 in | Wt 194.2 lb

## 2021-02-02 DIAGNOSIS — E119 Type 2 diabetes mellitus without complications: Secondary | ICD-10-CM | POA: Diagnosis not present

## 2021-02-02 DIAGNOSIS — E782 Mixed hyperlipidemia: Secondary | ICD-10-CM | POA: Diagnosis not present

## 2021-02-02 LAB — POCT GLYCOSYLATED HEMOGLOBIN (HGB A1C): HbA1c, POC (controlled diabetic range): 7.6 % — AB (ref 0.0–7.0)

## 2021-02-02 MED ORDER — SITAGLIPTIN PHOSPHATE 50 MG PO TABS
50.0000 mg | ORAL_TABLET | Freq: Every day | ORAL | 3 refills | Status: DC
  Filled 2021-02-02: qty 30, 30d supply, fill #0
  Filled 2021-02-28: qty 30, 30d supply, fill #1
  Filled 2021-03-28: qty 30, 30d supply, fill #2
  Filled 2021-04-30: qty 30, 30d supply, fill #3
  Filled 2021-06-02: qty 30, 30d supply, fill #4
  Filled 2021-07-01: qty 30, 30d supply, fill #5

## 2021-02-02 MED ORDER — METFORMIN HCL ER 500 MG PO TB24
1000.0000 mg | ORAL_TABLET | Freq: Every day | ORAL | 3 refills | Status: DC
  Filled 2021-02-02: qty 180, 90d supply, fill #0
  Filled 2021-06-27: qty 180, 90d supply, fill #1

## 2021-02-02 MED ORDER — LOVASTATIN 10 MG PO TABS: 10.0000 mg | ORAL_TABLET | Freq: Every day | ORAL | 0 refills | Status: DC

## 2021-02-02 NOTE — Patient Instructions (Signed)

## 2021-02-02 NOTE — Progress Notes (Signed)
Endocrinology Follow Up Note       02/02/2021, 10:22 AM   Subjective:    Patient ID: Alex Taylor, male    DOB: 1960/06/26.  Alex Taylor is being seen in follow up after being seen in consultation for management of currently uncontrolled symptomatic diabetes requested by  Lemmie Evens, MD.   Past Medical History:  Diagnosis Date   Diabetes mellitus without complication [facility])     Past Surgical History:  Procedure Laterality Date   BACK SURGERY     COLONOSCOPY  12/20/2011   Procedure: COLONOSCOPY;  Surgeon: Jamesetta So, MD;  Location: [facility];  Service: Gastroenterology;  Laterality: N/A;   KNEE ARTHROSCOPY     left knee    Social History   Socioeconomic History   Marital status: Married    Spouse name: Not on file   Number of children: Not on file   Years of education: Not on file   Highest education level: Not on file  Occupational History   Not on file  Tobacco Use   Smoking status: Former   Smokeless tobacco: Never  Vaping Use   Vaping Use: Never used  Substance and Sexual Activity   Alcohol use: Yes    Comment: occ   Drug use: No   Sexual activity: Not on file  Other Topics Concern   Not on file  Social History Narrative   Not on file   Social Determinants of Health   Financial Resource Strain: Not on file  Food Insecurity: Not on file  Transportation Needs: Not on file  Physical Activity: Not on file  Stress: Not on file  Social Connections: Not on file    Family History  Problem Relation Age of Onset   Cancer Mother    Osteoporosis Mother    Hypertension Father    Hyperlipidemia Father     Current Outpatient Medications on File Prior to Visit  Medication Sig Dispense Refill   glimepiride (AMARYL) 2 MG tablet Take 1 tablet (2 mg total) by mouth daily before breakfast. 90 tablet 3   glucose blood (ACCU-CHEK GUIDE) test strip Use as instructed to monitor  glucose 2 times daily 100 each 12   HYDROcodone-acetaminophen (NORCO) 10-325 MG tablet Take 1 tablet up to 4 times a day for severe pain (Do not fil before 4.8.22) 120 tablet 0   ibuprofen (ADVIL,MOTRIN) 200 MG tablet Take 200 mg by mouth every 6 (six) hours as needed. For pain     losartan (COZAAR) 50 MG tablet TAKE 1 TABLET BY MOUTH EVERY DAY FOR BLOOD PRESSURE & PROTECTION OF THE KIDNEYS IN DIABETES 90 tablet 3   No current facility-administered medications on file prior to visit.      ALLERGIES: Allergies  Allergen Reactions   Codeine Other (See Comments)    "makes me go stupid" per patient   Penicillins Other (See Comments)    Childhood reaction    VACCINATION STATUS:  There is no immunization history on file for this patient.  Hyperlipidemia This is a chronic problem. The current episode started more than 1 year ago. The symptoms are aggravated by fatty foods. He has tried statins for the  symptoms. The treatment provided moderate relief.  Hypertension This is a chronic problem. The current episode started more than 1 year ago. The problem has been waxing and waning. He has tried angiotensin blockers for the symptoms. The treatment provided mild relief.  Diabetes He presents for his follow-up diabetic visit. He has type 2 diabetes mellitus. Onset time: diagnosed at approx age of 49. His disease course has been improving. There are no hypoglycemic associated symptoms. Associated symptoms include weight loss. There are no hypoglycemic complications. Symptoms are stable. There are no diabetic complications. Risk factors for coronary artery disease include diabetes mellitus, dyslipidemia, male sex, obesity and hypertension. Current diabetic treatment includes oral agent (dual therapy). He is compliant with treatment most of the time. His weight is decreasing steadily. He is following a generally healthy diet. When asked about meal planning, he reported none. He has not had a previous  visit with a dietitian. He participates in exercise intermittently. His home blood glucose trend is increasing steadily. His overall blood glucose range is 180-200 mg/dl. (He presents today with his meter and logs showing increasing glycemic profile over the last week or two.  He says he is recovering from a bad sinus infection which is contributing to his higher readings.  His POCT A1c today is 7.6%, decreasing slightly from last visit of 7.8%.  He denies any hypoglycemia.) An ACE inhibitor/angiotensin II receptor blocker is being taken. He does not see a podiatrist.Eye exam is current.    Review of systems  Constitutional: + steadily decreasing body weight,  current Body mass index is 30.42 kg/m. , no fatigue, no subjective hyperthermia, no subjective hypothermia Eyes: no blurry vision, no xerophthalmia ENT: no sore throat, no nodules palpated in throat, no dysphagia/odynophagia, no hoarseness Cardiovascular: no chest pain, no shortness of breath, no palpitations, no leg swelling Respiratory: no cough, no shortness of breath Gastrointestinal: no nausea/vomiting/diarrhea Musculoskeletal: no muscle/joint aches Skin: no rashes, no hyperemia Neurological: no tremors, no numbness, no tingling, no dizziness Psychiatric: no depression, no anxiety  Objective:     BP (!) 153/77    Pulse 86    Ht 5\' 7"  (1.702 m)    Wt 194 lb 3.2 oz (88.1 kg)    BMI 30.42 kg/m   Wt Readings from Last 3 Encounters:  02/02/21 194 lb 3.2 oz (88.1 kg)  11/03/20 205 lb (93 kg)  07/31/20 209 lb 3.2 oz (94.9 kg)     BP Readings from Last 3 Encounters:  02/02/21 (!) 153/77  11/03/20 134/85  07/31/20 138/79      Physical Exam- Limited  Constitutional:  Body mass index is 30.42 kg/m. , not in acute distress, normal state of mind Eyes:  EOMI, no exophthalmos Neck: Supple Cardiovascular: RRR, no murmurs, rubs, or gallops, no edema Respiratory: Adequate breathing efforts, no crackles, rales, rhonchi, or  wheezing Musculoskeletal: no gross deformities, strength intact in all four extremities, no gross restriction of joint movements Skin:  no rashes, no hyperemia Neurological: no tremor with outstretched hands     CMP ( most recent) CMP     Component Value Date/Time   NA 138 01/27/2021 0936   K 5.1 01/27/2021 0936   CL 100 01/27/2021 0936   CO2 25 01/27/2021 0936   GLUCOSE 207 (H) 01/27/2021 0936   GLUCOSE 196 (H) 07/29/2019 0945   BUN 14 01/27/2021 0936   CREATININE 0.77 01/27/2021 0936   CALCIUM 9.2 01/27/2021 0936   PROT 7.1 01/27/2021 0936   ALBUMIN 4.7 01/27/2021 0936  AST 17 01/27/2021 0936   ALT 22 01/27/2021 0936   ALKPHOS 52 01/27/2021 0936   BILITOT 0.4 01/27/2021 0936   GFRNONAA 103 04/13/2020 0000   GFRAA >60 07/29/2019 0945     Diabetic Labs (most recent): Lab Results  Component Value Date   HGBA1C 7.6 (A) 02/02/2021   HGBA1C 7.8 (A) 11/03/2020   HGBA1C 8.5 (A) 07/16/2020     Lipid Panel ( most recent) Lipid Panel     Component Value Date/Time   CHOL 161 01/27/2021 0936   TRIG 137 01/27/2021 0936   HDL 57 01/27/2021 0936   CHOLHDL 2.8 01/27/2021 0936   LDLCALC 80 01/27/2021 0936   LABVLDL 24 01/27/2021 0936      Lab Results  Component Value Date   TSH 1.280 01/27/2021   FREET4 0.93 01/27/2021           Assessment & Plan:   1) Uncontrolled Type 2 Diabetes without complication:  He presents today with his meter and logs showing increasing glycemic profile over the last week or two.  He says he is recovering from a bad sinus infection which is contributing to his higher readings.  His POCT A1c today is 7.6%, decreasing slightly from last visit of 7.8%.  He denies any hypoglycemia.  - JAYSHON DOMMER has currently uncontrolled symptomatic type 2 DM since 60 years of age.  -Recent labs reviewed.  - I had a long discussion with him about the progressive nature of diabetes and the pathology behind its complications. -his diabetes is not  currently complicated but he remains at a high risk for more acute and chronic complications which include CAD, CVA, CKD, retinopathy, and neuropathy. These are all discussed in detail with him.  - Nutritional counseling repeated at each appointment due to patients tendency to fall back in to old habits.  - The patient admits there is a room for improvement in their diet and drink choices. -  Suggestion is made for the patient to avoid simple carbohydrates from their diet including Cakes, Sweet Desserts / Pastries, Ice Cream, Soda (diet and regular), Sweet Tea, Candies, Chips, Cookies, Sweet Pastries, Store Bought Juices, Alcohol in Excess of 1-2 drinks a day, Artificial Sweeteners, Coffee Creamer, and "Sugar-free" Products. This will help patient to have stable blood glucose profile and potentially avoid unintended weight gain.   - I encouraged the patient to switch to unprocessed or minimally processed complex starch and increased protein intake (animal or plant source), fruits, and vegetables.   - Patient is advised to stick to a routine mealtimes to eat 3 meals a day and avoid unnecessary snacks (to snack only to correct hypoglycemia).  - I have approached him with the following individualized plan to manage his diabetes and patient agrees:   -He is advised to continue His Metformin 1000 mg ER daily with breakfast and Glimepiride 2 mg po daily. He did not tolerate the Ozempic due to GI side effects thus was advised to stop.  I discussed and initiated Januvia 50 mg po daily.  -he is encouraged to continue monitoring blood glucose twice daily, before breakfast and before bed, and to call the clinic if he has readings less than 70 or greater than 300 for 3 tests in a row.   - Adjustment parameters are given to him for hypo and hyperglycemia in writing.  - Specific targets for  A1c; LDL, HDL, and Triglycerides were discussed with the patient.  2) Blood Pressure /Hypertension:  his blood  pressure  is not controlled to target today.   he is advised to continue his current medications including Losartan 50 mg p.o. daily with breakfast.  3) Lipids/Hyperlipidemia:    Review of his recent lipid panel from 01/27/21 showed controlled LDL at 80.  he is advised to continue Lovastatin 10 mg daily at bedtime.  Side effects and precautions discussed with him.  4)  Weight/Diet:  his Body mass index is 30.42 kg/m.  -  clearly complicating his diabetes care.   he is a candidate for weight loss-recently lost 5 lbs since last visit- a good development for him. I discussed with him the fact that loss of 5 - 10% of his  current body weight will have the most impact on his diabetes management.  Exercise, and detailed carbohydrates information provided  -  detailed on discharge instructions.  He has recently lost 11+ lbs, a good development for him.  5) Chronic Care/Health Maintenance: -he is on ACEI/ARB and Statin medications and is encouraged to initiate and continue to follow up with Ophthalmology, Dentist, Podiatrist at least yearly or according to recommendations, and advised to stay away from smoking. I have recommended yearly flu vaccine and pneumonia vaccine at least every 5 years; moderate intensity exercise for up to 150 minutes weekly; and sleep for at least 7 hours a day.  - he is advised to maintain close follow up with Lemmie Evens, MD for primary care needs, as well as his other providers for optimal and coordinated care.       I spent 30 minutes in the care of the patient today including review of labs from [place], Lipids, Thyroid Function, Hematology (current and previous including abstractions from other facilities); face-to-face time discussing  his blood glucose readings/logs, discussing hypoglycemia and hyperglycemia episodes and symptoms, medications doses, his options of short and long term treatment based on the latest standards of care / guidelines;  discussion about  incorporating lifestyle medicine;  and documenting the encounter.    Please refer to Patient Instructions for Blood Glucose Monitoring and Insulin/Medications Dosing Guide"  in media tab for additional information. Please  also refer to " Patient Self Inventory" in the Media  tab for reviewed elements of pertinent patient history.  Alex Taylor participated in the discussions, expressed understanding, and voiced agreement with the above plans.  All questions were answered to his satisfaction. he is encouraged to contact clinic should he have any questions or concerns prior to his return visit.     Follow up plan: - Return in about 3 months (around 05/03/2021) for Diabetes F/U with A1c in office, No previsit labs, Bring meter and logs.    Rayetta Pigg, [facility] [address] [place], [place] [address] Phone: [phone] Fax: [phone]  02/02/2021, 10:22 AM

## 2021-02-18 ENCOUNTER — Other Ambulatory Visit (HOSPITAL_COMMUNITY): Payer: Self-pay

## 2021-02-22 ENCOUNTER — Other Ambulatory Visit (HOSPITAL_COMMUNITY): Payer: Self-pay

## 2021-03-01 ENCOUNTER — Other Ambulatory Visit (HOSPITAL_COMMUNITY): Payer: Self-pay

## 2021-03-03 ENCOUNTER — Other Ambulatory Visit (HOSPITAL_COMMUNITY): Payer: Self-pay

## 2021-03-06 ENCOUNTER — Other Ambulatory Visit (HOSPITAL_COMMUNITY): Payer: Self-pay

## 2021-03-08 ENCOUNTER — Other Ambulatory Visit (HOSPITAL_COMMUNITY): Payer: Self-pay

## 2021-03-16 DIAGNOSIS — E782 Mixed hyperlipidemia: Secondary | ICD-10-CM | POA: Diagnosis not present

## 2021-03-16 DIAGNOSIS — Z23 Encounter for immunization: Secondary | ICD-10-CM | POA: Diagnosis not present

## 2021-03-16 DIAGNOSIS — E1165 Type 2 diabetes mellitus with hyperglycemia: Secondary | ICD-10-CM | POA: Diagnosis not present

## 2021-03-16 DIAGNOSIS — I10 Essential (primary) hypertension: Secondary | ICD-10-CM | POA: Diagnosis not present

## 2021-03-16 DIAGNOSIS — M5442 Lumbago with sciatica, left side: Secondary | ICD-10-CM | POA: Diagnosis not present

## 2021-03-17 ENCOUNTER — Other Ambulatory Visit (HOSPITAL_COMMUNITY): Payer: Self-pay

## 2021-03-17 MED ORDER — LOVASTATIN 10 MG PO TABS
ORAL_TABLET | ORAL | 3 refills | Status: DC
  Filled 2021-03-17: qty 90, 90d supply, fill #0
  Filled 2021-06-17: qty 90, 90d supply, fill #1

## 2021-03-17 MED ORDER — LOSARTAN POTASSIUM 50 MG PO TABS
ORAL_TABLET | ORAL | 3 refills | Status: DC
  Filled 2021-03-17: qty 90, 90d supply, fill #0
  Filled 2021-07-17: qty 90, 90d supply, fill #1

## 2021-03-24 ENCOUNTER — Other Ambulatory Visit (HOSPITAL_COMMUNITY): Payer: Self-pay

## 2021-03-29 ENCOUNTER — Other Ambulatory Visit (HOSPITAL_COMMUNITY): Payer: Self-pay

## 2021-03-31 ENCOUNTER — Other Ambulatory Visit (HOSPITAL_COMMUNITY): Payer: Self-pay

## 2021-04-01 ENCOUNTER — Other Ambulatory Visit (HOSPITAL_COMMUNITY): Payer: Self-pay

## 2021-04-05 ENCOUNTER — Other Ambulatory Visit (HOSPITAL_COMMUNITY): Payer: Self-pay

## 2021-04-05 MED ORDER — HYDROCODONE-ACETAMINOPHEN 10-325 MG PO TABS
ORAL_TABLET | ORAL | 0 refills | Status: DC
  Filled 2021-06-08: qty 120, 30d supply, fill #0

## 2021-04-05 MED ORDER — HYDROCODONE-ACETAMINOPHEN 10-325 MG PO TABS
ORAL_TABLET | ORAL | 0 refills | Status: DC
  Filled 2021-08-02: qty 120, 30d supply, fill #0

## 2021-04-05 MED ORDER — HYDROCODONE-ACETAMINOPHEN 10-325 MG PO TABS
ORAL_TABLET | ORAL | 0 refills | Status: DC
  Filled 2021-04-05: qty 120, 30d supply, fill #0

## 2021-04-08 ENCOUNTER — Other Ambulatory Visit (HOSPITAL_COMMUNITY): Payer: Self-pay

## 2021-04-14 ENCOUNTER — Other Ambulatory Visit (HOSPITAL_COMMUNITY): Payer: Self-pay

## 2021-04-30 ENCOUNTER — Other Ambulatory Visit (HOSPITAL_COMMUNITY): Payer: Self-pay

## 2021-05-01 ENCOUNTER — Other Ambulatory Visit (HOSPITAL_COMMUNITY): Payer: Self-pay

## 2021-05-03 ENCOUNTER — Other Ambulatory Visit: Payer: Self-pay

## 2021-05-03 ENCOUNTER — Encounter: Payer: Self-pay | Admitting: Nurse Practitioner

## 2021-05-03 ENCOUNTER — Other Ambulatory Visit (HOSPITAL_COMMUNITY): Payer: Self-pay

## 2021-05-03 ENCOUNTER — Ambulatory Visit: Payer: 59 | Admitting: Nurse Practitioner

## 2021-05-03 VITALS — BP 132/79 | HR 91 | Ht 67.0 in | Wt 204.0 lb

## 2021-05-03 DIAGNOSIS — E782 Mixed hyperlipidemia: Secondary | ICD-10-CM

## 2021-05-03 DIAGNOSIS — E119 Type 2 diabetes mellitus without complications: Secondary | ICD-10-CM | POA: Diagnosis not present

## 2021-05-03 LAB — POCT GLYCOSYLATED HEMOGLOBIN (HGB A1C): HbA1c, POC (controlled diabetic range): 7.4 % — AB (ref 0.0–7.0)

## 2021-05-03 NOTE — Patient Instructions (Signed)

## 2021-05-03 NOTE — Progress Notes (Signed)
Endocrinology Follow Up Note       05/03/2021, 10:19 AM   Subjective:    Patient ID: Alex Taylor, male    DOB: 12/10/1960.  Alex Taylor is being seen in follow up after being seen in consultation for management of currently uncontrolled symptomatic diabetes requested by  Lemmie Evens, MD.   Past Medical History:  Diagnosis Date   Diabetes mellitus without complication [facility])     Past Surgical History:  Procedure Laterality Date   BACK SURGERY     COLONOSCOPY  12/20/2011   Procedure: COLONOSCOPY;  Surgeon: Jamesetta So, MD;  Location: [facility];  Service: Gastroenterology;  Laterality: N/A;   KNEE ARTHROSCOPY     left knee    Social History   Socioeconomic History   Marital status: Married    Spouse name: Not on file   Number of children: Not on file   Years of education: Not on file   Highest education level: Not on file  Occupational History   Not on file  Tobacco Use   Smoking status: Former   Smokeless tobacco: Never  Vaping Use   Vaping Use: Never used  Substance and Sexual Activity   Alcohol use: Yes    Comment: occ   Drug use: No   Sexual activity: Not on file  Other Topics Concern   Not on file  Social History Narrative   Not on file   Social Determinants of Health   Financial Resource Strain: Not on file  Food Insecurity: Not on file  Transportation Needs: Not on file  Physical Activity: Not on file  Stress: Not on file  Social Connections: Not on file    Family History  Problem Relation Age of Onset   Cancer Mother    Osteoporosis Mother    Hypertension Father    Hyperlipidemia Father     Current Outpatient Medications on File Prior to Visit  Medication Sig Dispense Refill   glucose blood (ACCU-CHEK GUIDE) test strip Use as instructed to monitor glucose 2 times daily 100 each 12   HYDROcodone-acetaminophen (NORCO) 10-325 MG tablet Take 1 tablet up to 4  times a day for severe pain (Do not fil before 4.8.22) 120 tablet 0   [START ON 05/05/2021] HYDROcodone-acetaminophen (NORCO) 10-325 MG tablet Take 1 tablet by mouth up to 4 times a day for severe pain (fill 05/05/21) 120 tablet 0   HYDROcodone-acetaminophen (NORCO) 10-325 MG tablet Take 1 tablet by mouth up to 4 times a day for severe pain 120 tablet 0   [START ON 06/04/2021] HYDROcodone-acetaminophen (NORCO) 10-325 MG tablet Take 1 tablet by mouth up to 4 times a day for severe pain (fill 06/04/21) 120 tablet 0   ibuprofen (ADVIL,MOTRIN) 200 MG tablet Take 200 mg by mouth every 6 (six) hours as needed. For pain     losartan (COZAAR) 50 MG tablet Take one tablet each day for high blood pressure and protection of the kidney in diabetes. 90 tablet 3   lovastatin (MEVACOR) 10 MG tablet Take 1 tablet (10 mg total) by mouth at bedtime. 60 tablet 0   lovastatin (MEVACOR) 10 MG tablet Take 1 tablet each day  for high cholesterol. 90 tablet 3   metFORMIN (GLUCOPHAGE-XR) 500 MG 24 hr tablet Take 2 tablets by mouth daily with breakfast. 180 tablet 3   sitaGLIPtin (JANUVIA) 50 MG tablet Take 1 tablet by mouth daily. 90 tablet 3   losartan (COZAAR) 50 MG tablet TAKE 1 TABLET BY MOUTH EVERY DAY FOR BLOOD PRESSURE & PROTECTION OF THE KIDNEYS IN DIABETES 90 tablet 3   No current facility-administered medications on file prior to visit.      ALLERGIES: Allergies  Allergen Reactions   Codeine Other (See Comments)    "makes me go stupid" per patient   Penicillins Other (See Comments)    Childhood reaction    VACCINATION STATUS:  There is no immunization history on file for this patient.  Hyperlipidemia This is a chronic problem. The current episode started more than 1 year ago. The symptoms are aggravated by fatty foods. He has tried statins for the symptoms. The treatment provided moderate relief.  Hypertension This is a chronic problem. The current episode started more than 1 year ago. The problem has  been resolved. He has tried angiotensin blockers for the symptoms. The treatment provided mild relief.  Diabetes He presents for his follow-up diabetic visit. He has type 2 diabetes mellitus. Onset time: diagnosed at approx age of 42. His disease course has been improving. There are no hypoglycemic associated symptoms. Pertinent negatives for diabetes include no weight loss. There are no hypoglycemic complications. Symptoms are stable. There are no diabetic complications. Risk factors for coronary artery disease include diabetes mellitus, dyslipidemia, male sex, obesity and hypertension. Current diabetic treatment includes oral agent (triple therapy). He is compliant with treatment most of the time. His weight is fluctuating minimally. He is following a generally healthy diet. When asked about meal planning, he reported none. He has not had a previous visit with a dietitian. He participates in exercise intermittently. His home blood glucose trend is fluctuating minimally. His overall blood glucose range is 140-180 mg/dl. (He presents today with his meter and logs showing above target fasting and tight postprandial glycemic profile.  His POCT A1c today is 7.4%, improving from last visit of 7.6%.  He denies any significant hypoglycemia, says he will start getting symptoms around 90.) An ACE inhibitor/angiotensin II receptor blocker is being taken. He does not see a podiatrist.Eye exam is current.    Review of systems  Constitutional: + Minimally fluctuating body weight,  current Body mass index is 31.95 kg/m. , no fatigue, no subjective hyperthermia, no subjective hypothermia Eyes: no blurry vision, no xerophthalmia ENT: no sore throat, no nodules palpated in throat, no dysphagia/odynophagia, no hoarseness Cardiovascular: no chest pain, no shortness of breath, no palpitations, no leg swelling Respiratory: no cough, no shortness of breath Gastrointestinal: no nausea/vomiting/diarrhea Musculoskeletal: no  muscle/joint aches Skin: no rashes, no hyperemia Neurological: no tremors, no numbness, no tingling, no dizziness Psychiatric: no depression, no anxiety  Objective:     BP 132/79    Pulse 91    Ht '5\' 7"'$  (1.702 m)    Wt 204 lb (92.5 kg)    SpO2 98%    BMI 31.95 kg/m   Wt Readings from Last 3 Encounters:  05/03/21 204 lb (92.5 kg)  02/02/21 194 lb 3.2 oz (88.1 kg)  11/03/20 205 lb (93 kg)     BP Readings from Last 3 Encounters:  05/03/21 132/79  02/02/21 (!) 153/77  11/03/20 134/85      Physical Exam- Limited  Constitutional:  Body mass  index is 31.95 kg/m. , not in acute distress, normal state of mind Eyes:  EOMI, no exophthalmos Neck: Supple Cardiovascular: RRR, no murmurs, rubs, or gallops, no edema Respiratory: Adequate breathing efforts, no crackles, rales, rhonchi, or wheezing Musculoskeletal: no gross deformities, strength intact in all four extremities, no gross restriction of joint movements Skin:  no rashes, no hyperemia Neurological: no tremor with outstretched hands     CMP ( most recent) CMP     Component Value Date/Time   NA 138 01/27/2021 0936   K 5.1 01/27/2021 0936   CL 100 01/27/2021 0936   CO2 25 01/27/2021 0936   GLUCOSE 207 (H) 01/27/2021 0936   GLUCOSE 196 (H) 07/29/2019 0945   BUN 14 01/27/2021 0936   CREATININE 0.77 01/27/2021 0936   CALCIUM 9.2 01/27/2021 0936   PROT 7.1 01/27/2021 0936   ALBUMIN 4.7 01/27/2021 0936   AST 17 01/27/2021 0936   ALT 22 01/27/2021 0936   ALKPHOS 52 01/27/2021 0936   BILITOT 0.4 01/27/2021 0936   GFRNONAA 103 04/13/2020 0000   GFRAA >60 07/29/2019 0945     Diabetic Labs (most recent): Lab Results  Component Value Date   HGBA1C 7.4 (A) 05/03/2021   HGBA1C 7.6 (A) 02/02/2021   HGBA1C 7.8 (A) 11/03/2020     Lipid Panel ( most recent) Lipid Panel     Component Value Date/Time   CHOL 161 01/27/2021 0936   TRIG 137 01/27/2021 0936   HDL 57 01/27/2021 0936   CHOLHDL 2.8 01/27/2021 0936   LDLCALC  80 01/27/2021 0936   LABVLDL 24 01/27/2021 0936      Lab Results  Component Value Date   TSH 1.280 01/27/2021   FREET4 0.93 01/27/2021           Assessment & Plan:   1) Uncontrolled Type 2 Diabetes without complication:  He presents today with his meter and logs showing above target fasting and tight postprandial glycemic profile.  His POCT A1c today is 7.4%, improving from last visit of 7.6%.  He denies any significant hypoglycemia, says he will start getting symptoms around 90.  He also just started a new job and is still adjusting to his new schedule.  He has plans to incorporate more physical activity now that the weather is getting warmer.  - JEFFERIE HOLSTON has currently uncontrolled symptomatic type 2 DM since 61 years of age.  -Recent labs reviewed.  - I had a long discussion with him about the progressive nature of diabetes and the pathology behind its complications. -his diabetes is not currently complicated but he remains at a high risk for more acute and chronic complications which include CAD, CVA, CKD, retinopathy, and neuropathy. These are all discussed in detail with him.  - Nutritional counseling repeated at each appointment due to patients tendency to fall back in to old habits.  - The patient admits there is a room for improvement in their diet and drink choices. -  Suggestion is made for the patient to avoid simple carbohydrates from their diet including Cakes, Sweet Desserts / Pastries, Ice Cream, Soda (diet and regular), Sweet Tea, Candies, Chips, Cookies, Sweet Pastries, Store Bought Juices, Alcohol in Excess of 1-2 drinks a day, Artificial Sweeteners, Coffee Creamer, and "Sugar-free" Products. This will help patient to have stable blood glucose profile and potentially avoid unintended weight gain.   - I encouraged the patient to switch to unprocessed or minimally processed complex starch and increased protein intake (animal or plant source), fruits,  and  vegetables.   - Patient is advised to stick to a routine mealtimes to eat 3 meals a day and avoid unnecessary snacks (to snack only to correct hypoglycemia).  - I have approached him with the following individualized plan to manage his diabetes and patient agrees:   -He is advised to continue His Metformin 1000 mg ER daily with breakfast and Januvia 50 mg po daily.  I stopped his Glimepiride due to the suspicion that his glucose drops while sleeping causing the liver to dump glucose, causing higher morning readings.  -he is encouraged to continue monitoring blood glucose twice daily, before breakfast and before bed, and to call the clinic if he has readings less than 70 or greater than 300 for 3 tests in a row.   - Adjustment parameters are given to him for hypo and hyperglycemia in writing.  - Specific targets for  A1c; LDL, HDL, and Triglycerides were discussed with the patient.  2) Blood Pressure /Hypertension:  his blood pressure is controlled to target today.   he is advised to continue his current medications including Losartan 50 mg p.o. daily with breakfast.  3) Lipids/Hyperlipidemia:    Review of his recent lipid panel from 01/27/21 showed controlled LDL at 80.  he is advised to continue Lovastatin 10 mg daily at bedtime.  Side effects and precautions discussed with him.  4)  Weight/Diet:  his Body mass index is 31.95 kg/m.  -  clearly complicating his diabetes care.   he is a candidate for weight loss-recently lost 5 lbs since last visit- a good development for him. I discussed with him the fact that loss of 5 - 10% of his  current body weight will have the most impact on his diabetes management.  Exercise, and detailed carbohydrates information provided  -  detailed on discharge instructions.  He has recently lost 11+ lbs, a good development for him.  5) Chronic Care/Health Maintenance: -he is on ACEI/ARB and Statin medications and is encouraged to initiate and continue to follow  up with Ophthalmology, Dentist, Podiatrist at least yearly or according to recommendations, and advised to stay away from smoking. I have recommended yearly flu vaccine and pneumonia vaccine at least every 5 years; moderate intensity exercise for up to 150 minutes weekly; and sleep for at least 7 hours a day.  - he is advised to maintain close follow up with Lemmie Evens, MD for primary care needs, as well as his other providers for optimal and coordinated care.     I spent 30 minutes in the care of the patient today including review of labs from [facility], Lipids, Thyroid Function, Hematology (current and previous including abstractions from other facilities); face-to-face time discussing  his blood glucose readings/logs, discussing hypoglycemia and hyperglycemia episodes and symptoms, medications doses, his options of short and long term treatment based on the latest standards of care / guidelines;  discussion about incorporating lifestyle medicine;  and documenting the encounter.    Please refer to Patient Instructions for Blood Glucose Monitoring and Insulin/Medications Dosing Guide"  in media tab for additional information. Please  also refer to " Patient Self Inventory" in the Media  tab for reviewed elements of pertinent patient history.  Alex Taylor participated in the discussions, expressed understanding, and voiced agreement with the above plans.  All questions were answered to his satisfaction. he is encouraged to contact clinic should he have any questions or concerns prior to his return visit.     Follow  up plan: - Return in about 3 months (around 08/03/2021) for Diabetes F/U- A1c and UM in office, Previsit labs, Bring meter and logs.   Rayetta Pigg, [facility] [address] [place],  [address] Phone: [phone] Fax: [phone]  05/03/2021, 10:19 AM

## 2021-05-10 ENCOUNTER — Encounter: Payer: Self-pay | Admitting: *Deleted

## 2021-05-24 ENCOUNTER — Other Ambulatory Visit: Payer: Self-pay | Admitting: *Deleted

## 2021-05-24 ENCOUNTER — Ambulatory Visit: Payer: 59 | Admitting: Surgery

## 2021-05-24 DIAGNOSIS — Z1211 Encounter for screening for malignant neoplasm of colon: Secondary | ICD-10-CM

## 2021-05-25 ENCOUNTER — Ambulatory Visit: Payer: 59 | Admitting: General Surgery

## 2021-06-02 ENCOUNTER — Other Ambulatory Visit (HOSPITAL_COMMUNITY): Payer: Self-pay

## 2021-06-08 ENCOUNTER — Other Ambulatory Visit (HOSPITAL_COMMUNITY): Payer: Self-pay

## 2021-06-18 ENCOUNTER — Other Ambulatory Visit (HOSPITAL_COMMUNITY): Payer: Self-pay

## 2021-06-18 DIAGNOSIS — M5442 Lumbago with sciatica, left side: Secondary | ICD-10-CM | POA: Diagnosis not present

## 2021-06-18 DIAGNOSIS — E1165 Type 2 diabetes mellitus with hyperglycemia: Secondary | ICD-10-CM | POA: Diagnosis not present

## 2021-06-18 DIAGNOSIS — M25562 Pain in left knee: Secondary | ICD-10-CM | POA: Diagnosis not present

## 2021-06-18 DIAGNOSIS — I10 Essential (primary) hypertension: Secondary | ICD-10-CM | POA: Diagnosis not present

## 2021-06-18 MED ORDER — HYDROCODONE-ACETAMINOPHEN 10-325 MG PO TABS: ORAL_TABLET | ORAL | 0 refills | Status: DC

## 2021-06-28 ENCOUNTER — Telehealth: Payer: Self-pay | Admitting: Nurse Practitioner

## 2021-06-28 ENCOUNTER — Other Ambulatory Visit (HOSPITAL_COMMUNITY): Payer: Self-pay

## 2021-06-28 MED ORDER — METFORMIN HCL ER 500 MG PO TB24: 1000.0000 mg | ORAL_TABLET | Freq: Every day | ORAL | 0 refills | Status: DC

## 2021-06-28 NOTE — Telephone Encounter (Signed)
Pt's wife called and said that his Metformin has not been delivered yet from [facility] and he is out. Can about 6 tablets be called into [facility]? ?

## 2021-06-28 NOTE — Telephone Encounter (Signed)
Sent to [facility] ?

## 2021-07-02 ENCOUNTER — Other Ambulatory Visit (HOSPITAL_COMMUNITY): Payer: Self-pay

## 2021-07-17 ENCOUNTER — Other Ambulatory Visit (HOSPITAL_COMMUNITY): Payer: Self-pay

## 2021-07-20 ENCOUNTER — Other Ambulatory Visit (HOSPITAL_COMMUNITY): Payer: Self-pay

## 2021-07-26 ENCOUNTER — Telehealth: Payer: Self-pay | Admitting: Nurse Practitioner

## 2021-07-26 MED ORDER — SITAGLIPTIN PHOSPHATE 100 MG PO TABS: 100.0000 mg | ORAL_TABLET | Freq: Every day | ORAL | 3 refills | Status: DC

## 2021-07-26 NOTE — Telephone Encounter (Signed)
Informed patient wife and she will relay message to patient.

## 2021-07-26 NOTE — Telephone Encounter (Signed)
Pt wife called in regards to patient readings, he is testing 2x a day.   6/2 174, 126  6/3 192, 176  6/4 201, 294 -- pt did not eat supper this night  6/5 203

## 2021-07-26 NOTE — Telephone Encounter (Signed)
Make sure he is eating 3 meals per day to prevent glucose from being dumped by the liver.  Lets go ahead and increase his Januvia to 100 mg po daily (he can double up on his current 50 mg tabs and I will send in refill for correct dose to his pharmacy).  I sent this to [facility]

## 2021-07-30 ENCOUNTER — Other Ambulatory Visit (HOSPITAL_COMMUNITY): Payer: Self-pay

## 2021-07-30 ENCOUNTER — Telehealth: Payer: Self-pay | Admitting: Nurse Practitioner

## 2021-07-30 ENCOUNTER — Other Ambulatory Visit: Payer: Self-pay | Admitting: Nurse Practitioner

## 2021-07-30 MED ORDER — SITAGLIPTIN PHOSPHATE 100 MG PO TABS: 100.0000 mg | ORAL_TABLET | Freq: Every day | ORAL | 3 refills | Status: DC

## 2021-07-30 NOTE — Telephone Encounter (Signed)
I sent it to [facility].

## 2021-07-30 NOTE — Telephone Encounter (Signed)
Pt said his Januvia needs to go [facility] this time. It was sent to [facility] and he does not use [facility]. Thank you

## 2021-08-02 ENCOUNTER — Other Ambulatory Visit (HOSPITAL_COMMUNITY): Payer: Self-pay

## 2021-08-04 ENCOUNTER — Ambulatory Visit: Payer: 59 | Admitting: Nurse Practitioner

## 2021-08-10 ENCOUNTER — Ambulatory Visit: Payer: 59 | Admitting: Nurse Practitioner

## 2021-08-10 DIAGNOSIS — E119 Type 2 diabetes mellitus without complications: Secondary | ICD-10-CM | POA: Diagnosis not present

## 2021-08-11 LAB — COMPREHENSIVE METABOLIC PANEL
ALT: 21 IU/L (ref 0–44)
AST: 14 IU/L (ref 0–40)
Albumin/Globulin Ratio: 1.8 (ref 1.2–2.2)
Albumin: 4.3 g/dL (ref 3.8–4.8)
Alkaline Phosphatase: 55 IU/L (ref 44–121)
BUN/Creatinine Ratio: 18 (ref 10–24)
BUN: 15 mg/dL (ref 8–27)
Bilirubin Total: 0.6 mg/dL (ref 0.0–1.2)
CO2: 20 mmol/L (ref 20–29)
Calcium: 9.1 mg/dL (ref 8.6–10.2)
Chloride: 94 mmol/L — ABNORMAL LOW (ref 96–106)
Creatinine, Ser: 0.83 mg/dL (ref 0.76–1.27)
Globulin, Total: 2.4 g/dL (ref 1.5–4.5)
Glucose: 256 mg/dL — ABNORMAL HIGH (ref 70–99)
Potassium: 4.5 mmol/L (ref 3.5–5.2)
Sodium: 132 mmol/L — ABNORMAL LOW (ref 134–144)
Total Protein: 6.7 g/dL (ref 6.0–8.5)
eGFR: 100 mL/min/{1.73_m2} (ref >59)

## 2021-08-13 ENCOUNTER — Other Ambulatory Visit (HOSPITAL_COMMUNITY): Payer: Self-pay

## 2021-08-13 ENCOUNTER — Other Ambulatory Visit: Payer: Self-pay | Admitting: Nurse Practitioner

## 2021-08-13 MED ORDER — SITAGLIPTIN PHOSPHATE 100 MG PO TABS
100.0000 mg | ORAL_TABLET | Freq: Every day | ORAL | 0 refills | Status: DC
  Filled 2021-08-13: qty 90, 90d supply, fill #0

## 2021-08-16 ENCOUNTER — Other Ambulatory Visit (HOSPITAL_COMMUNITY): Payer: Self-pay

## 2021-08-16 ENCOUNTER — Telehealth: Payer: Self-pay | Admitting: Nurse Practitioner

## 2021-08-16 MED ORDER — SITAGLIPTIN PHOSPHATE 100 MG PO TABS
100.0000 mg | ORAL_TABLET | Freq: Every day | ORAL | 3 refills | Status: DC
  Filled 2021-08-16 – 2021-08-23 (×3): qty 90, 90d supply, fill #0

## 2021-08-19 NOTE — Patient Instructions (Signed)
Diabetes Mellitus and Foot Care Foot care is an important part of your health, especially when you have diabetes. Diabetes may cause you to have problems because of poor blood flow (circulation) to your feet and legs, which can cause your skin to: Become thinner and drier. Break more easily. Heal more slowly. Peel and crack. You may also have nerve damage (neuropathy) in your legs and feet, causing decreased feeling in them. This means that you may not notice minor injuries to your feet that could lead to more serious problems. Noticing and addressing any potential problems early is the best way to prevent future foot problems. How to care for your feet Foot hygiene  Wash your feet daily with warm water and mild soap. Do not use hot water. Then, pat your feet and the areas between your toes until they are completely dry. Do not soak your feet as this can dry your skin. Trim your toenails straight across. Do not dig under them or around the cuticle. File the edges of your nails with an emery board or nail file. Apply a moisturizing lotion or petroleum jelly to the skin on your feet and to dry, brittle toenails. Use lotion that does not contain alcohol and is unscented. Do not apply lotion between your toes. Shoes and socks Wear clean socks or stockings every day. Make sure they are not too tight. Do not wear knee-high stockings since they may decrease blood flow to your legs. Wear shoes that fit properly and have enough cushioning. Always look in your shoes before you put them on to be sure there are no objects inside. To break in new shoes, wear them for just a few hours a day. This prevents injuries on your feet. Wounds, scrapes, corns, and calluses  Check your feet daily for blisters, cuts, bruises, sores, and redness. If you cannot see the bottom of your feet, use a mirror or ask someone for help. Do not cut corns or calluses or try to remove them with medicine. If you find a minor scrape,  cut, or break in the skin on your feet, keep it and the skin around it clean and dry. You may clean these areas with mild soap and water. Do not clean the area with peroxide, alcohol, or iodine. If you have a wound, scrape, corn, or callus on your foot, look at it several times a day to make sure it is healing and not infected. Check for: Redness, swelling, or pain. Fluid or blood. Warmth. Pus or a bad smell. General tips Do not cross your legs. This may decrease blood flow to your feet. Do not use heating pads or hot water bottles on your feet. They may burn your skin. If you have lost feeling in your feet or legs, you may not know this is happening until it is too late. Protect your feet from hot and cold by wearing shoes, such as at the beach or on hot pavement. Schedule a complete foot exam at least once a year (annually) or more often if you have foot problems. Report any cuts, sores, or bruises to your health care provider immediately. Where to find more information American Diabetes Association: www.diabetes.org Association of Diabetes Care & Education Specialists: www.diabeteseducator.org Contact a health care provider if: You have a medical condition that increases your risk of infection and you have any cuts, sores, or bruises on your feet. You have an injury that is not healing. You have redness on your legs or feet. You   feel burning or tingling in your legs or feet. You have pain or cramps in your legs and feet. Your legs or feet are numb. Your feet always feel cold. You have pain around any toenails. Get help right away if: You have a wound, scrape, corn, or callus on your foot and: You have pain, swelling, or redness that gets worse. You have fluid or blood coming from the wound, scrape, corn, or callus. Your wound, scrape, corn, or callus feels warm to the touch. You have pus or a bad smell coming from the wound, scrape, corn, or callus. You have a fever. You have a red  line going up your leg. Summary Check your feet every day for blisters, cuts, bruises, sores, and redness. Apply a moisturizing lotion or petroleum jelly to the skin on your feet and to dry, brittle toenails. Wear shoes that fit properly and have enough cushioning. If you have foot problems, report any cuts, sores, or bruises to your health care provider immediately. Schedule a complete foot exam at least once a year (annually) or more often if you have foot problems. This information is not intended to replace advice given to you by your health care provider. Make sure you discuss any questions you have with your health care provider. Document Revised: 08/29/2019 Document Reviewed: 08/29/2019 Elsevier Patient Education  2023 [employer]  

## 2021-08-20 ENCOUNTER — Encounter: Payer: Self-pay | Admitting: Nurse Practitioner

## 2021-08-20 ENCOUNTER — Ambulatory Visit: Payer: 59 | Admitting: Nurse Practitioner

## 2021-08-20 VITALS — BP 134/80 | HR 89 | Ht 67.0 in | Wt 200.0 lb

## 2021-08-20 DIAGNOSIS — E119 Type 2 diabetes mellitus without complications: Secondary | ICD-10-CM | POA: Diagnosis not present

## 2021-08-20 DIAGNOSIS — E782 Mixed hyperlipidemia: Secondary | ICD-10-CM | POA: Diagnosis not present

## 2021-08-20 LAB — POCT GLYCOSYLATED HEMOGLOBIN (HGB A1C): HbA1c POC (<> result, manual entry): 7.6 % (ref 4.0–5.6)

## 2021-08-20 MED ORDER — TRESIBA FLEXTOUCH 100 UNIT/ML ~~LOC~~ SOPN
30.0000 [IU] | PEN_INJECTOR | Freq: Every day | SUBCUTANEOUS | 3 refills | Status: DC
Start: 2021-08-20 — End: 2022-02-25

## 2021-08-20 MED ORDER — PEN NEEDLES 32G X 4 MM MISC: 3 refills | Status: DC

## 2021-08-20 NOTE — Progress Notes (Signed)
Endocrinology Follow Up Note       08/20/2021, 10:41 AM   Subjective:    Patient ID: Alex Taylor, male    DOB: 03/10/1960.  Alex Taylor is being seen in follow up after being seen in consultation for management of currently uncontrolled symptomatic diabetes requested by  Lemmie Evens, MD.   Past Medical History:  Diagnosis Date   Diabetes mellitus without complication [facility]     Past Surgical History:  Procedure Laterality Date   BACK SURGERY     COLONOSCOPY  12/20/2011   Procedure: COLONOSCOPY;  Surgeon: Jamesetta So, MD;  Location: [facility];  Service: Gastroenterology;  Laterality: N/A;   KNEE ARTHROSCOPY     left knee    Social History   Socioeconomic History   Marital status: Married    Spouse name: Not on file   Number of children: Not on file   Years of education: Not on file   Highest education level: Not on file  Occupational History   Not on file  Tobacco Use   Smoking status: Former   Smokeless tobacco: Never  Vaping Use   Vaping Use: Never used  Substance and Sexual Activity   Alcohol use: Yes    Comment: occ   Drug use: No   Sexual activity: Not on file  Other Topics Concern   Not on file  Social History Narrative   Not on file   Social Determinants of Health   Financial Resource Strain: Not on file  Food Insecurity: Not on file  Transportation Needs: Not on file  Physical Activity: Not on file  Stress: Not on file  Social Connections: Not on file    Family History  Problem Relation Age of Onset   Cancer Mother    Osteoporosis Mother    Hypertension Father    Hyperlipidemia Father     Current Outpatient Medications on File Prior to Visit  Medication Sig Dispense Refill   glucose blood (ACCU-CHEK GUIDE) test strip Use as instructed to monitor glucose 2 times daily 100 each 12   HYDROcodone-acetaminophen (NORCO) 10-325 MG tablet Take 1 tablet up to 4  times a day for severe pain (Do not fil before 4.8.22) 120 tablet 0   ibuprofen (ADVIL,MOTRIN) 200 MG tablet Take 200 mg by mouth every 6 (six) hours as needed. For pain     losartan (COZAAR) 50 MG tablet TAKE 1 TABLET BY MOUTH EVERY DAY FOR BLOOD PRESSURE & PROTECTION OF THE KIDNEYS IN DIABETES 90 tablet 3   lovastatin (MEVACOR) 10 MG tablet Take 1 tablet (10 mg total) by mouth at bedtime. 60 tablet 0   metFORMIN (GLUCOPHAGE-XR) 500 MG 24 hr tablet Take 2 tablets by mouth daily with breakfast. 6 tablet 0   sitaGLIPtin (JANUVIA) 100 MG tablet Take 1 tablet by mouth daily. 90 tablet 3   No current facility-administered medications on file prior to visit.      ALLERGIES: Allergies  Allergen Reactions   Codeine Other (See Comments)    "makes me go stupid" per patient   Penicillins Other (See Comments)    Childhood reaction    VACCINATION STATUS:  There is no immunization  history on file for this patient.  Diabetes He presents for his follow-up diabetic visit. He has type 2 diabetes mellitus. Onset time: diagnosed at approx age of 61. His disease course has been stable. There are no hypoglycemic associated symptoms. Pertinent negatives for diabetes include no weight loss. There are no hypoglycemic complications. Symptoms are stable. There are no diabetic complications. Risk factors for coronary artery disease include diabetes mellitus, dyslipidemia, male sex, obesity and hypertension. Current diabetic treatment includes oral agent (dual therapy). He is compliant with treatment most of the time. His weight is fluctuating minimally. He is following a generally healthy diet. When asked about meal planning, he reported none. He has not had a previous visit with a dietitian. He participates in exercise intermittently. His home blood glucose trend is fluctuating minimally. His overall blood glucose range is 180-200 mg/dl. (He presents today, with his wife on conference call, with his meter and logs  showing fluctuating glycemic profile still above target.  His POCT A1c today is 7.6%.  He denies any recent changes with diet or meds.  He does note he eats later in the evenings now that the days are longer, he stays outside working longer.  He denies any hypoglycemia.) An ACE inhibitor/angiotensin II receptor blocker is being taken. He does not see a podiatrist.Eye exam is current.     Review of systems  Constitutional: + Minimally fluctuating body weight,  current Body mass index is 31.32 kg/m. , no fatigue, no subjective hyperthermia, no subjective hypothermia Eyes: no blurry vision, no xerophthalmia ENT: no sore throat, no nodules palpated in throat, no dysphagia/odynophagia, no hoarseness Cardiovascular: no chest pain, no shortness of breath, no palpitations, no leg swelling Respiratory: no cough, no shortness of breath Gastrointestinal: no nausea/vomiting/diarrhea Musculoskeletal: no muscle/joint aches Skin: no rashes, no hyperemia Neurological: no tremors, no numbness, no tingling, no dizziness Psychiatric: no depression, no anxiety  Objective:     BP 134/80   Pulse 89   Ht '5\' 7"'$  (1.702 m)   Wt 200 lb (90.7 kg)   BMI 31.32 kg/m   Wt Readings from Last 3 Encounters:  08/20/21 200 lb (90.7 kg)  05/03/21 204 lb (92.5 kg)  02/02/21 194 lb 3.2 oz (88.1 kg)     BP Readings from Last 3 Encounters:  08/20/21 134/80  05/03/21 132/79  02/02/21 (!) 153/77       Physical Exam- Limited  Constitutional:  Body mass index is 31.32 kg/m. , not in acute distress, normal state of mind Eyes:  EOMI, no exophthalmos Neck: Supple Cardiovascular: RRR, no murmurs, rubs, or gallops, no edema Respiratory: Adequate breathing efforts, no crackles, rales, rhonchi, or wheezing Musculoskeletal: no gross deformities, strength intact in all four extremities, no gross restriction of joint movements Skin:  no rashes, no hyperemia Neurological: no tremor with outstretched hands     CMP (  most recent) CMP     Component Value Date/Time   NA 132 (L) 08/10/2021 0902   K 4.5 08/10/2021 0902   CL 94 (L) 08/10/2021 0902   CO2 20 08/10/2021 0902   GLUCOSE 256 (H) 08/10/2021 0902   GLUCOSE 196 (H) 07/29/2019 0945   BUN 15 08/10/2021 0902   CREATININE 0.83 08/10/2021 0902   CALCIUM 9.1 08/10/2021 0902   PROT 6.7 08/10/2021 0902   ALBUMIN 4.3 08/10/2021 0902   AST 14 08/10/2021 0902   ALT 21 08/10/2021 0902   ALKPHOS 55 08/10/2021 0902   BILITOT 0.6 08/10/2021 0902   GFRNONAA 103 04/13/2020  0000   GFRAA >60 07/29/2019 0945     Diabetic Labs (most recent): Lab Results  Component Value Date   HGBA1C 7.6 08/20/2021   HGBA1C 7.4 (A) 05/03/2021   HGBA1C 7.6 (A) 02/02/2021     Lipid Panel ( most recent) Lipid Panel     Component Value Date/Time   CHOL 161 01/27/2021 0936   TRIG 137 01/27/2021 0936   HDL 57 01/27/2021 0936   CHOLHDL 2.8 01/27/2021 0936   LDLCALC 80 01/27/2021 0936   LABVLDL 24 01/27/2021 0936      Lab Results  Component Value Date   TSH 1.280 01/27/2021   FREET4 0.93 01/27/2021           Assessment & Plan:   1) Controlled Type 2 Diabetes without complication:  He presents today, with his wife on conference call, with his meter and logs showing fluctuating glycemic profile still above target.  His POCT A1c today is 7.6%.  He denies any recent changes with diet or meds.  He does note he eats later in the evenings now that the days are longer, he stays outside working longer.  He denies any hypoglycemia.  - Alex Taylor has currently uncontrolled symptomatic type 2 DM since 61 years of age.  -Recent labs reviewed.  - I had a long discussion with him about the progressive nature of diabetes and the pathology behind its complications. -his diabetes is not currently complicated but he remains at a high risk for more acute and chronic complications which include CAD, CVA, CKD, retinopathy, and neuropathy. These are all discussed in detail  with him.  The following Lifestyle Medicine recommendations according to [facility]) were discussed and offered to patient and he agrees to start the journey:  A. Whole Foods, Plant-based plate comprising of fruits and vegetables, plant-based proteins, whole-grain carbohydrates was discussed in detail with the patient.   A list for source of those nutrients were also provided to the patient.  Patient will use only water or unsweetened tea for hydration. B.  The need to stay away from risky substances including alcohol, smoking; obtaining 7 to 9 hours of restorative sleep, at least 150 minutes of moderate intensity exercise weekly, the importance of healthy social connections,  and stress reduction techniques were discussed. C.  A full color page of  Calorie density of various food groups per pound showing examples of each food groups was provided to the patient.  - Nutritional counseling repeated at each appointment due to patients tendency to fall back in to old habits.  - The patient admits there is a room for improvement in their diet and drink choices. -  Suggestion is made for the patient to avoid simple carbohydrates from their diet including Cakes, Sweet Desserts / Pastries, Ice Cream, Soda (diet and regular), Sweet Tea, Candies, Chips, Cookies, Sweet Pastries, Store Bought Juices, Alcohol in Excess of 1-2 drinks a day, Artificial Sweeteners, Coffee Creamer, and "Sugar-free" Products. This will help patient to have stable blood glucose profile and potentially avoid unintended weight gain.   - I encouraged the patient to switch to unprocessed or minimally processed complex starch and increased protein intake (animal or plant source), fruits, and vegetables.   - Patient is advised to stick to a routine mealtimes to eat 3 meals a day and avoid unnecessary snacks (to snack only to correct hypoglycemia).  - I have approached him with the following individualized  plan to manage his diabetes and patient  agrees:   -He is advised to continue His Metformin 1000 mg ER daily with breakfast and Januvia 100 mg po daily. He could benefit from initiation of basal insulin.  I discussed and initiated Tresiba 30 units SQ nightly (sample provided from office).  We went over proper insulin administration technique today.  -he is encouraged to continue monitoring blood glucose twice daily, before breakfast and before bed, and to call the clinic if he has readings less than 70 or greater than 300 for 3 tests in a row.  He could benefit from CGM device given insulin added to regimen.  He is afraid it wont stay on given his job, therefore I did give him sample Dexcom G7 to see if it is feasible.  - Adjustment parameters are given to him for hypo and hyperglycemia in writing.  - He did not tolerate GLP1 due to nausea and vomiting.  - Specific targets for  A1c; LDL, HDL, and Triglycerides were discussed with the patient.  2) Blood Pressure /Hypertension:  his blood pressure is controlled to target today.   he is advised to continue his current medications including Losartan 50 mg p.o. daily with breakfast.  3) Lipids/Hyperlipidemia:    Review of his recent lipid panel from 01/27/21 showed controlled LDL at 80.  he is advised to continue Lovastatin 10 mg daily at bedtime.  Side effects and precautions discussed with him.  4)  Weight/Diet:  his Body mass index is 31.32 kg/m.  -  clearly complicating his diabetes care.   he is a candidate for weight loss-recently lost 5 lbs since last visit- a good development for him. I discussed with him the fact that loss of 5 - 10% of his  current body weight will have the most impact on his diabetes management.  Exercise, and detailed carbohydrates information provided  -  detailed on discharge instructions.  He has recently lost 11+ lbs, a good development for him.  5) Chronic Care/Health Maintenance: -he is on ACEI/ARB and Statin  medications and is encouraged to initiate and continue to follow up with Ophthalmology, Dentist, Podiatrist at least yearly or according to recommendations, and advised to stay away from smoking. I have recommended yearly flu vaccine and pneumonia vaccine at least every 5 years; moderate intensity exercise for up to 150 minutes weekly; and sleep for at least 7 hours a day.  - he is advised to maintain close follow up with Lemmie Evens, MD for primary care needs, as well as his other providers for optimal and coordinated care.      I spent 40 minutes in the care of the patient today including review of labs from [facility], Lipids, Thyroid Function, Hematology (current and previous including abstractions from other facilities); face-to-face time discussing  his blood glucose readings/logs, discussing hypoglycemia and hyperglycemia episodes and symptoms, medications doses, his options of short and long term treatment based on the latest standards of care / guidelines;  discussion about incorporating lifestyle medicine;  and documenting the encounter. Risk reduction counseling performed per USPSTF guidelines to reduce obesity and cardiovascular risk factors.     Please refer to Patient Instructions for Blood Glucose Monitoring and Insulin/Medications Dosing Guide"  in media tab for additional information. Please  also refer to " Patient Self Inventory" in the Media  tab for reviewed elements of pertinent patient history.  Alex Taylor participated in the discussions, expressed understanding, and voiced agreement with the above plans.  All questions were answered to his satisfaction. he  is encouraged to contact clinic should he have any questions or concerns prior to his return visit.     Follow up plan: - Return in about 1 month (around 09/19/2021) for Diabetes F/U, Bring meter and logs.   Rayetta Pigg, [facility] [address] [place], [place] [address] Phone:  [phone] Fax: [phone]  08/20/2021, 10:41 AM

## 2021-08-23 ENCOUNTER — Other Ambulatory Visit (HOSPITAL_COMMUNITY): Payer: Self-pay

## 2021-09-13 ENCOUNTER — Other Ambulatory Visit (HOSPITAL_COMMUNITY): Payer: Self-pay

## 2021-09-17 ENCOUNTER — Ambulatory Visit: Payer: Self-pay | Admitting: Nurse Practitioner

## 2021-09-17 ENCOUNTER — Other Ambulatory Visit (HOSPITAL_COMMUNITY): Payer: Self-pay

## 2021-09-24 ENCOUNTER — Other Ambulatory Visit (HOSPITAL_COMMUNITY): Payer: Self-pay

## 2021-09-27 ENCOUNTER — Telehealth: Payer: Self-pay | Admitting: Nurse Practitioner

## 2021-09-27 MED ORDER — METFORMIN HCL ER 500 MG PO TB24: 1000.0000 mg | ORAL_TABLET | Freq: Every day | ORAL | 0 refills | Status: DC

## 2021-09-27 NOTE — Telephone Encounter (Signed)
New message    1. Which medications need to be refilled? (please list name of each medication and dose if known) metFORMIN (GLUCOPHAGE-XR) 500 MG 24 hr tablet  2. Which pharmacy/location (including street and city if local pharmacy) is medication to be sent to? [place] [facility]    3. Do they need a 30 day or 90 day supply? 30 days supply

## 2021-09-27 NOTE — Telephone Encounter (Signed)
Rx sent 

## 2021-10-15 ENCOUNTER — Encounter: Payer: Self-pay | Admitting: Nurse Practitioner

## 2021-10-15 ENCOUNTER — Ambulatory Visit: Payer: BC Managed Care – PPO | Admitting: Nurse Practitioner

## 2021-10-15 VITALS — BP 158/100 | HR 87 | Ht 67.0 in | Wt 206.8 lb

## 2021-10-15 DIAGNOSIS — E119 Type 2 diabetes mellitus without complications: Secondary | ICD-10-CM | POA: Diagnosis not present

## 2021-10-15 DIAGNOSIS — E782 Mixed hyperlipidemia: Secondary | ICD-10-CM | POA: Diagnosis not present

## 2021-10-15 MED ORDER — METFORMIN HCL ER 500 MG PO TB24: 1000.0000 mg | ORAL_TABLET | Freq: Every day | ORAL | 3 refills | Status: DC

## 2021-10-15 NOTE — Progress Notes (Unsigned)
Endocrinology Follow Up Note       10/15/2021, 11:39 AM   Subjective:    Patient ID: Alex Taylor, male    DOB: 04-24-1960.  Alex Taylor is being seen in follow up after being seen in consultation for management of currently uncontrolled symptomatic diabetes requested by  Lemmie Evens, MD.   Past Medical History:  Diagnosis Date   Diabetes mellitus without complication [facility])     Past Surgical History:  Procedure Laterality Date   BACK SURGERY     COLONOSCOPY  12/20/2011   Procedure: COLONOSCOPY;  Surgeon: Jamesetta So, MD;  Location: [facility];  Service: Gastroenterology;  Laterality: N/A;   KNEE ARTHROSCOPY     left knee    Social History   Socioeconomic History   Marital status: Married    Spouse name: Not on file   Number of children: Not on file   Years of education: Not on file   Highest education level: Not on file  Occupational History   Not on file  Tobacco Use   Smoking status: Former   Smokeless tobacco: Never  Vaping Use   Vaping Use: Never used  Substance and Sexual Activity   Alcohol use: Yes    Comment: occ   Drug use: No   Sexual activity: Not on file  Other Topics Concern   Not on file  Social History Narrative   Not on file   Social Determinants of Health   Financial Resource Strain: Not on file  Food Insecurity: Not on file  Transportation Needs: Not on file  Physical Activity: Not on file  Stress: Not on file  Social Connections: Not on file    Family History  Problem Relation Age of Onset   Cancer Mother    Osteoporosis Mother    Hypertension Father    Hyperlipidemia Father     Current Outpatient Medications on File Prior to Visit  Medication Sig Dispense Refill   glucose blood (ACCU-CHEK GUIDE) test strip Use as instructed to monitor glucose 2 times daily 100 each 12   HYDROcodone-acetaminophen (NORCO) 10-325 MG tablet Take 1 tablet up to 4  times a day for severe pain (Do not fil before 4.8.22) 120 tablet 0   ibuprofen (ADVIL,MOTRIN) 200 MG tablet Take 200 mg by mouth every 6 (six) hours as needed. For pain     insulin degludec (TRESIBA FLEXTOUCH) 100 UNIT/ML FlexTouch Pen Inject 30 Units into the skin at bedtime. 15 mL 3   Insulin Pen Needle (PEN NEEDLES) 32G X 4 MM MISC Use to inject insulin once daily 100 each 3   lovastatin (MEVACOR) 10 MG tablet Take 1 tablet (10 mg total) by mouth at bedtime. 60 tablet 0   sitaGLIPtin (JANUVIA) 100 MG tablet Take 1 tablet by mouth daily. 90 tablet 3   losartan (COZAAR) 50 MG tablet TAKE 1 TABLET BY MOUTH EVERY DAY FOR BLOOD PRESSURE & PROTECTION OF THE KIDNEYS IN DIABETES 90 tablet 3   No current facility-administered medications on file prior to visit.      ALLERGIES: Allergies  Allergen Reactions   Codeine Other (See Comments)    "makes me go stupid" per  patient   Penicillins Other (See Comments)    Childhood reaction    VACCINATION STATUS:  There is no immunization history on file for this patient.  Diabetes He presents for his follow-up diabetic visit. He has type 2 diabetes mellitus. Onset time: diagnosed at approx age of 46. His disease course has been improving. There are no hypoglycemic associated symptoms. Pertinent negatives for diabetes include no weight loss. There are no hypoglycemic complications. Symptoms are stable. There are no diabetic complications. Risk factors for coronary artery disease include diabetes mellitus, dyslipidemia, male sex, obesity and hypertension. Current diabetic treatment includes oral agent (dual therapy) and insulin injections. He is compliant with treatment most of the time. His weight is fluctuating minimally. He is following a generally healthy diet. When asked about meal planning, he reported none. He has not had a previous visit with a dietitian. He participates in exercise intermittently. His home blood glucose trend is decreasing steadily.  His breakfast blood glucose range is generally 110-130 mg/dl. His overall blood glucose range is 140-180 mg/dl. (He presents today with his meter and logs showing at goal glycemic profile overall.  He was not due for another A1c today.  He has tolerated the addition of basal insulin well, denies any hypoglycemia. ) An ACE inhibitor/angiotensin II receptor blocker is being taken. He does not see a podiatrist.Eye exam is current.    Review of systems  Constitutional: + Minimally fluctuating body weight,  current Body mass index is 32.39 kg/m. , no fatigue, no subjective hyperthermia, no subjective hypothermia Eyes: no blurry vision, no xerophthalmia ENT: no sore throat, no nodules palpated in throat, no dysphagia/odynophagia, no hoarseness Cardiovascular: no chest pain, no shortness of breath, no palpitations, no leg swelling Respiratory: no cough, no shortness of breath Gastrointestinal: no nausea/vomiting/diarrhea Musculoskeletal: no muscle/joint aches Skin: no rashes, no hyperemia Neurological: no tremors, no numbness, no tingling, no dizziness Psychiatric: no depression, no anxiety  Objective:     BP (!) 158/100 (BP Location: Right Arm, Patient Position: Sitting, Cuff Size: Large)   Pulse 87   Ht '5\' 7"'$  (1.702 m)   Wt 206 lb 12.8 oz (93.8 kg)   BMI 32.39 kg/m   Wt Readings from Last 3 Encounters:  10/15/21 206 lb 12.8 oz (93.8 kg)  08/20/21 200 lb (90.7 kg)  05/03/21 204 lb (92.5 kg)     BP Readings from Last 3 Encounters:  10/15/21 (!) 158/100  08/20/21 134/80  05/03/21 132/79     Physical Exam- Limited  Constitutional:  Body mass index is 32.39 kg/m. , not in acute distress, normal state of mind Eyes:  EOMI, no exophthalmos Neck: Supple Cardiovascular: RRR, no murmurs, rubs, or gallops, no edema Respiratory: Adequate breathing efforts, no crackles, rales, rhonchi, or wheezing Musculoskeletal: no gross deformities, strength intact in all four extremities, no gross  restriction of joint movements Skin:  no rashes, no hyperemia Neurological: no tremor with outstretched hands   CMP ( most recent) CMP     Component Value Date/Time   NA 132 (L) 08/10/2021 0902   K 4.5 08/10/2021 0902   CL 94 (L) 08/10/2021 0902   CO2 20 08/10/2021 0902   GLUCOSE 256 (H) 08/10/2021 0902   GLUCOSE 196 (H) 07/29/2019 0945   BUN 15 08/10/2021 0902   CREATININE 0.83 08/10/2021 0902   CALCIUM 9.1 08/10/2021 0902   PROT 6.7 08/10/2021 0902   ALBUMIN 4.3 08/10/2021 0902   AST 14 08/10/2021 0902   ALT 21 08/10/2021 0902  ALKPHOS 55 08/10/2021 0902   BILITOT 0.6 08/10/2021 0902   GFRNONAA 103 04/13/2020 0000   GFRAA >60 07/29/2019 0945     Diabetic Labs (most recent): Lab Results  Component Value Date   HGBA1C 7.6 08/20/2021   HGBA1C 7.4 (A) 05/03/2021   HGBA1C 7.6 (A) 02/02/2021     Lipid Panel ( most recent) Lipid Panel     Component Value Date/Time   CHOL 161 01/27/2021 0936   TRIG 137 01/27/2021 0936   HDL 57 01/27/2021 0936   CHOLHDL 2.8 01/27/2021 0936   LDLCALC 80 01/27/2021 0936   LABVLDL 24 01/27/2021 0936      Lab Results  Component Value Date   TSH 1.280 01/27/2021   FREET4 0.93 01/27/2021           Assessment & Plan:   1) Controlled Type 2 Diabetes without complication:  He presents today with his meter and logs showing at goal glycemic profile overall.  He was not due for another A1c today.  He has tolerated the addition of basal insulin well, denies any hypoglycemia.   - Alex Taylor has currently uncontrolled symptomatic type 2 DM since 61 years of age.  -Recent labs reviewed.  - I had a long discussion with him about the progressive nature of diabetes and the pathology behind its complications. -his diabetes is not currently complicated but he remains at a high risk for more acute and chronic complications which include CAD, CVA, CKD, retinopathy, and neuropathy. These are all discussed in detail with him.  The  following Lifestyle Medicine recommendations according to [facility]) were discussed and offered to patient and he agrees to start the journey:  A. Whole Foods, Plant-based plate comprising of fruits and vegetables, plant-based proteins, whole-grain carbohydrates was discussed in detail with the patient.   A list for source of those nutrients were also provided to the patient.  Patient will use only water or unsweetened tea for hydration. B.  The need to stay away from risky substances including alcohol, smoking; obtaining 7 to 9 hours of restorative sleep, at least 150 minutes of moderate intensity exercise weekly, the importance of healthy social connections,  and stress reduction techniques were discussed. C.  A full color page of  Calorie density of various food groups per pound showing examples of each food groups was provided to the patient.  - Nutritional counseling repeated at each appointment due to patients tendency to fall back in to old habits.  - The patient admits there is a room for improvement in their diet and drink choices. -  Suggestion is made for the patient to avoid simple carbohydrates from their diet including Cakes, Sweet Desserts / Pastries, Ice Cream, Soda (diet and regular), Sweet Tea, Candies, Chips, Cookies, Sweet Pastries, Store Bought Juices, Alcohol in Excess of 1-2 drinks a day, Artificial Sweeteners, Coffee Creamer, and "Sugar-free" Products. This will help patient to have stable blood glucose profile and potentially avoid unintended weight gain.   - I encouraged the patient to switch to unprocessed or minimally processed complex starch and increased protein intake (animal or plant source), fruits, and vegetables.   - Patient is advised to stick to a routine mealtimes to eat 3 meals a day and avoid unnecessary snacks (to snack only to correct hypoglycemia).  - I have approached him with the following individualized plan to manage his  diabetes and patient agrees:   -Given his improved glycemic profile, no changes will be  made to his medication regimen today.  He is advised to continue Tresiba 30 units SQ nightly, Metformin 1000 mg ER daily with breakfast, and Januvia 100 mg po daily.   -he is encouraged to continue monitoring blood glucose twice daily, before breakfast and before bed, and to call the clinic if he has readings less than 70 or greater than 300 for 3 tests in a row.  He could benefit from CGM device given insulin added to regimen.  He is afraid it wont stay on given his job, therefore I did give him sample Dexcom G7 to see if it is feasible.  He has not yet tried it.  - Adjustment parameters are given to him for hypo and hyperglycemia in writing.  - He did not tolerate GLP1 due to nausea and vomiting.  - Specific targets for  A1c; LDL, HDL, and Triglycerides were discussed with the patient.  2) Blood Pressure /Hypertension:  his blood pressure is not controlled to target today but says it is always elevated at doctors visits.   he is advised to continue his current medications including Losartan 50 mg p.o. daily with breakfast.  3) Lipids/Hyperlipidemia:    Review of his recent lipid panel from 01/27/21 showed controlled LDL at 80.  he is advised to continue Lovastatin 10 mg daily at bedtime.  Side effects and precautions discussed with him.  4)  Weight/Diet:  his Body mass index is 32.39 kg/m.  -  clearly complicating his diabetes care.   he is a candidate for weight loss.  I discussed with him the fact that loss of 5 - 10% of his  current body weight will have the most impact on his diabetes management.  Exercise, and detailed carbohydrates information provided  -  detailed on discharge instructions.    5) Chronic Care/Health Maintenance: -he is on ACEI/ARB and Statin medications and is encouraged to initiate and continue to follow up with Ophthalmology, Dentist, Podiatrist at least yearly or according to  recommendations, and advised to stay away from smoking. I have recommended yearly flu vaccine and pneumonia vaccine at least every 5 years; moderate intensity exercise for up to 150 minutes weekly; and sleep for at least 7 hours a day.  - he is advised to maintain close follow up with Lemmie Evens, MD for primary care needs, as well as his other providers for optimal and coordinated care.     I spent 30 minutes in the care of the patient today including review of labs from [facility], Lipids, Thyroid Function, Hematology (current and previous including abstractions from other facilities); face-to-face time discussing  his blood glucose readings/logs, discussing hypoglycemia and hyperglycemia episodes and symptoms, medications doses, his options of short and long term treatment based on the latest standards of care / guidelines;  discussion about incorporating lifestyle medicine;  and documenting the encounter. Risk reduction counseling performed per USPSTF guidelines to reduce obesity and cardiovascular risk factors.     Please refer to Patient Instructions for Blood Glucose Monitoring and Insulin/Medications Dosing Guide"  in media tab for additional information. Please  also refer to " Patient Self Inventory" in the Media  tab for reviewed elements of pertinent patient history.  Alex Taylor participated in the discussions, expressed understanding, and voiced agreement with the above plans.  All questions were answered to his satisfaction. he is encouraged to contact clinic should he have any questions or concerns prior to his return visit.     Follow up plan: - Return  in about 3 months (around 01/15/2022) for Diabetes F/U with A1c in office, No previsit labs, Bring meter and logs.   Rayetta Pigg, [facility] [address] [place], [place] [address] Phone: [phone] Fax: [phone]  10/15/2021, 11:39 AM

## 2022-01-07 DIAGNOSIS — E119 Type 2 diabetes mellitus without complications: Secondary | ICD-10-CM | POA: Diagnosis not present

## 2022-01-07 DIAGNOSIS — E039 Hypothyroidism, unspecified: Secondary | ICD-10-CM | POA: Diagnosis not present

## 2022-01-07 DIAGNOSIS — E7800 Pure hypercholesterolemia, unspecified: Secondary | ICD-10-CM | POA: Diagnosis not present

## 2022-01-07 DIAGNOSIS — E1165 Type 2 diabetes mellitus with hyperglycemia: Secondary | ICD-10-CM | POA: Diagnosis not present

## 2022-01-21 ENCOUNTER — Ambulatory Visit: Payer: BC Managed Care – PPO | Admitting: Nurse Practitioner

## 2022-02-04 ENCOUNTER — Ambulatory Visit: Payer: BC Managed Care – PPO | Admitting: Nurse Practitioner

## 2022-02-04 DIAGNOSIS — E119 Type 2 diabetes mellitus without complications: Secondary | ICD-10-CM

## 2022-02-04 DIAGNOSIS — E782 Mixed hyperlipidemia: Secondary | ICD-10-CM

## 2022-02-25 ENCOUNTER — Encounter: Payer: Self-pay | Admitting: Nurse Practitioner

## 2022-02-25 ENCOUNTER — Ambulatory Visit: Payer: BC Managed Care – PPO | Admitting: Nurse Practitioner

## 2022-02-25 VITALS — BP 145/86 | HR 88 | Ht 67.0 in | Wt 213.0 lb

## 2022-02-25 DIAGNOSIS — E119 Type 2 diabetes mellitus without complications: Secondary | ICD-10-CM | POA: Diagnosis not present

## 2022-02-25 DIAGNOSIS — E782 Mixed hyperlipidemia: Secondary | ICD-10-CM | POA: Diagnosis not present

## 2022-02-25 LAB — POCT GLYCOSYLATED HEMOGLOBIN (HGB A1C): Hemoglobin A1C: 7.5 % — AB (ref 4.0–5.6)

## 2022-02-25 MED ORDER — TRESIBA FLEXTOUCH 100 UNIT/ML ~~LOC~~ SOPN
35.0000 [IU] | PEN_INJECTOR | Freq: Every day | SUBCUTANEOUS | 3 refills | Status: DC
Start: 2022-02-25 — End: 2022-07-01

## 2022-02-25 NOTE — Progress Notes (Signed)
Endocrinology Follow Up Note       02/25/2022, 9:02 AM   Subjective:    Patient ID: Alex Taylor, male    DOB: 06-Oct-1960.  Alex Taylor is being seen in follow up after being seen in consultation for management of currently uncontrolled symptomatic diabetes requested by  Lemmie Evens, MD.   Past Medical History:  Diagnosis Date   Diabetes mellitus without complication [facility])     Past Surgical History:  Procedure Laterality Date   BACK SURGERY     COLONOSCOPY  12/20/2011   Procedure: COLONOSCOPY;  Surgeon: Jamesetta So, MD;  Location: [facility];  Service: Gastroenterology;  Laterality: N/A;   KNEE ARTHROSCOPY     left knee    Social History   Socioeconomic History   Marital status: Married    Spouse name: Not on file   Number of children: Not on file   Years of education: Not on file   Highest education level: Not on file  Occupational History   Not on file  Tobacco Use   Smoking status: Former   Smokeless tobacco: Never  Vaping Use   Vaping Use: Never used  Substance and Sexual Activity   Alcohol use: Yes    Comment: occ   Drug use: No   Sexual activity: Not on file  Other Topics Concern   Not on file  Social History Narrative   Not on file   Social Determinants of Health   Financial Resource Strain: Not on file  Food Insecurity: Not on file  Transportation Needs: Not on file  Physical Activity: Not on file  Stress: Not on file  Social Connections: Not on file    Family History  Problem Relation Age of Onset   Cancer Mother    Osteoporosis Mother    Hypertension Father    Hyperlipidemia Father     Current Outpatient Medications on File Prior to Visit  Medication Sig Dispense Refill   glucose blood (ACCU-CHEK GUIDE) test strip Use as instructed to monitor glucose 2 times daily 100 each 12   HYDROcodone-acetaminophen (NORCO) 10-325 MG tablet Take 1 tablet up to 4 times  a day for severe pain (Do not fil before 4.8.22) 120 tablet 0   ibuprofen (ADVIL,MOTRIN) 200 MG tablet Take 200 mg by mouth every 6 (six) hours as needed. For pain     Insulin Pen Needle (PEN NEEDLES) 32G X 4 MM MISC Use to inject insulin once daily 100 each 3   lovastatin (MEVACOR) 10 MG tablet Take 1 tablet (10 mg total) by mouth at bedtime. 60 tablet 0   metFORMIN (GLUCOPHAGE-XR) 500 MG 24 hr tablet Take 2 tablets (1,000 mg total) by mouth daily with breakfast. 180 tablet 3   sitaGLIPtin (JANUVIA) 100 MG tablet Take 1 tablet by mouth daily. 90 tablet 3   losartan (COZAAR) 50 MG tablet TAKE 1 TABLET BY MOUTH EVERY DAY FOR BLOOD PRESSURE & PROTECTION OF THE KIDNEYS IN DIABETES 90 tablet 3   No current facility-administered medications on file prior to visit.      ALLERGIES: Allergies  Allergen Reactions   Codeine Other (See Comments)    "makes me go  stupid" per patient   Penicillins Other (See Comments)    Childhood reaction    VACCINATION STATUS:  There is no immunization history on file for this patient.  Diabetes He presents for his follow-up diabetic visit. He has type 2 diabetes mellitus. Onset time: diagnosed at approx age of 38. His disease course has been stable. There are no hypoglycemic associated symptoms. Pertinent negatives for diabetes include no weight loss. There are no hypoglycemic complications. Symptoms are stable. There are no diabetic complications. Risk factors for coronary artery disease include diabetes mellitus, dyslipidemia, male sex, obesity and hypertension. Current diabetic treatment includes oral agent (dual therapy) and insulin injections. He is compliant with treatment most of the time. His weight is fluctuating minimally. He is following a generally healthy diet. When asked about meal planning, he reported none. He has not had a previous visit with a dietitian. He participates in exercise intermittently. His home blood glucose trend is increasing steadily.  His breakfast blood glucose range is generally 140-180 mg/dl. His overall blood glucose range is >200 mg/dl. (He presents today with his meter and logs showing slightly above target glycemic profile overall.  His POCT A1c today is 7.5%, essentially unchanged from previous visit.  He notes he has over indulged during the holidays.  He also notes he thinks he may have been taking his Metformin wrong.  He never tried the CGM sample given at last visit.) An ACE inhibitor/angiotensin II receptor blocker is being taken. He does not see a podiatrist.Eye exam is current.    Review of systems  Constitutional: + steadily increasing body weight,  current Body mass index is 33.36 kg/m. , no fatigue, no subjective hyperthermia, no subjective hypothermia Eyes: no blurry vision, no xerophthalmia ENT: no sore throat, no nodules palpated in throat, no dysphagia/odynophagia, no hoarseness Cardiovascular: no chest pain, no shortness of breath, no palpitations, no leg swelling Respiratory: no cough, no shortness of breath Gastrointestinal: no nausea/vomiting/diarrhea Musculoskeletal: no muscle/joint aches Skin: no rashes, no hyperemia Neurological: no tremors, no numbness, no tingling, no dizziness Psychiatric: no depression, no anxiety  Objective:     BP (!) 145/86 (BP Location: Left Arm, Patient Position: Sitting, Cuff Size: Large)   Pulse 88   Ht '5\' 7"'$  (1.702 m)   Wt 213 lb (96.6 kg)   BMI 33.36 kg/m   Wt Readings from Last 3 Encounters:  02/25/22 213 lb (96.6 kg)  10/15/21 206 lb 12.8 oz (93.8 kg)  08/20/21 200 lb (90.7 kg)     BP Readings from Last 3 Encounters:  02/25/22 (!) 145/86  10/15/21 (!) 158/100  08/20/21 134/80      Physical Exam- Limited  Constitutional:  Body mass index is 33.36 kg/m. , not in acute distress, normal state of mind Eyes:  EOMI, no exophthalmos Musculoskeletal: no gross deformities, strength intact in all four extremities, no gross restriction of joint  movements Skin:  no rashes, no hyperemia Neurological: no tremor with outstretched hands   Diabetic Foot Exam - Simple   Simple Foot Form Visual Inspection No deformities, no ulcerations, no other skin breakdown bilaterally: Yes Sensation Testing Intact to touch and monofilament testing bilaterally: Yes Pulse Check Posterior Tibialis and Dorsalis pulse intact bilaterally: Yes Comments     CMP ( most recent) CMP     Component Value Date/Time   NA 132 (L) 08/10/2021 0902   K 4.5 08/10/2021 0902   CL 94 (L) 08/10/2021 0902   CO2 20 08/10/2021 0902   GLUCOSE 256 (H)  08/10/2021 0902   GLUCOSE 196 (H) 07/29/2019 0945   BUN 15 08/10/2021 0902   CREATININE 0.83 08/10/2021 0902   CALCIUM 9.1 08/10/2021 0902   PROT 6.7 08/10/2021 0902   ALBUMIN 4.3 08/10/2021 0902   AST 14 08/10/2021 0902   ALT 21 08/10/2021 0902   ALKPHOS 55 08/10/2021 0902   BILITOT 0.6 08/10/2021 0902   GFRNONAA 103 04/13/2020 0000   GFRAA >60 07/29/2019 0945     Diabetic Labs (most recent): Lab Results  Component Value Date   HGBA1C 7.5 (A) 02/25/2022   HGBA1C 7.6 08/20/2021   HGBA1C 7.4 (A) 05/03/2021     Lipid Panel ( most recent) Lipid Panel     Component Value Date/Time   CHOL 161 01/27/2021 0936   TRIG 137 01/27/2021 0936   HDL 57 01/27/2021 0936   CHOLHDL 2.8 01/27/2021 0936   LDLCALC 80 01/27/2021 0936   LABVLDL 24 01/27/2021 0936      Lab Results  Component Value Date   TSH 1.280 01/27/2021   FREET4 0.93 01/27/2021           Assessment & Plan:   1) Controlled Type 2 Diabetes without complication:  He presents today with his meter and logs showing slightly above target glycemic profile overall.  His POCT A1c today is 7.5%, essentially unchanged from previous visit.  He notes he has over indulged during the holidays.  He also notes he thinks he may have been taking his Metformin wrong.  He never tried the CGM sample given at last visit.  - Alex Taylor has currently  uncontrolled symptomatic type 2 DM since 62 years of age.  -Recent labs reviewed.  - I had a long discussion with him about the progressive nature of diabetes and the pathology behind its complications. -his diabetes is not currently complicated but he remains at a high risk for more acute and chronic complications which include CAD, CVA, CKD, retinopathy, and neuropathy. These are all discussed in detail with him.  The following Lifestyle Medicine recommendations according to [facility]) were discussed and offered to patient and he agrees to start the journey:  A. Whole Foods, Plant-based plate comprising of fruits and vegetables, plant-based proteins, whole-grain carbohydrates was discussed in detail with the patient.   A list for source of those nutrients were also provided to the patient.  Patient will use only water or unsweetened tea for hydration. B.  The need to stay away from risky substances including alcohol, smoking; obtaining 7 to 9 hours of restorative sleep, at least 150 minutes of moderate intensity exercise weekly, the importance of healthy social connections,  and stress reduction techniques were discussed. C.  A full color page of  Calorie density of various food groups per pound showing examples of each food groups was provided to the patient.  - Nutritional counseling repeated at each appointment due to patients tendency to fall back in to old habits.  - The patient admits there is a room for improvement in their diet and drink choices. -  Suggestion is made for the patient to avoid simple carbohydrates from their diet including Cakes, Sweet Desserts / Pastries, Ice Cream, Soda (diet and regular), Sweet Tea, Candies, Chips, Cookies, Sweet Pastries, Store Bought Juices, Alcohol in Excess of 1-2 drinks a day, Artificial Sweeteners, Coffee Creamer, and "Sugar-free" Products. This will help patient to have stable blood glucose profile and potentially  avoid unintended weight gain.   - I encouraged the  patient to switch to unprocessed or minimally processed complex starch and increased protein intake (animal or plant source), fruits, and vegetables.   - Patient is advised to stick to a routine mealtimes to eat 3 meals a day and avoid unnecessary snacks (to snack only to correct hypoglycemia).  - I have approached him with the following individualized plan to manage his diabetes and patient agrees:   -Given his fasting hyperglycemia, will increase his Tresiba slightly to 35 units SQ nightly. He can continue Metformin 1000 mg ER daily with breakfast (to check his prescription bottle when he gets home), and Januvia 100 mg po daily.   -he is encouraged to continue monitoring blood glucose twice daily, before breakfast and before bed, and to call the clinic if he has readings less than 70 or greater than 300 for 3 tests in a row.  He could benefit from CGM device given insulin added to regimen.  He is afraid it wont stay on given his job, therefore I did give him sample Dexcom G7 at last visit to see if it is feasible.  He has not yet tried it but I urged him to try.  - Adjustment parameters are given to him for hypo and hyperglycemia in writing.  - He did not tolerate GLP1 due to nausea and vomiting.  - Specific targets for  A1c; LDL, HDL, and Triglycerides were discussed with the patient.  2) Blood Pressure /Hypertension:  his blood pressure is not controlled to target today but says it is always elevated at doctors visits.   he is advised to continue his current medications including Losartan 50 mg p.o. daily with breakfast.  3) Lipids/Hyperlipidemia:    Review of his recent lipid panel from 01/27/21 showed controlled LDL at 80.  he is advised to continue Lovastatin 10 mg daily at bedtime.  Side effects and precautions discussed with him.  4)  Weight/Diet:  his Body mass index is 33.36 kg/m.  -  clearly complicating his diabetes care.   he  is a candidate for weight loss.  I discussed with him the fact that loss of 5 - 10% of his  current body weight will have the most impact on his diabetes management.  Exercise, and detailed carbohydrates information provided  -  detailed on discharge instructions.    5) Chronic Care/Health Maintenance: -he is on ACEI/ARB and Statin medications and is encouraged to initiate and continue to follow up with Ophthalmology, Dentist, Podiatrist at least yearly or according to recommendations, and advised to stay away from smoking. I have recommended yearly flu vaccine and pneumonia vaccine at least every 5 years; moderate intensity exercise for up to 150 minutes weekly; and sleep for at least 7 hours a day.  - he is advised to maintain close follow up with Lemmie Evens, MD for primary care needs, as well as his other providers for optimal and coordinated care.     I spent 35 minutes in the care of the patient today including review of labs from [place], Lipids, Thyroid Function, Hematology (current and previous including abstractions from other facilities); face-to-face time discussing  his blood glucose readings/logs, discussing hypoglycemia and hyperglycemia episodes and symptoms, medications doses, his options of short and long term treatment based on the latest standards of care / guidelines;  discussion about incorporating lifestyle medicine;  and documenting the encounter. Risk reduction counseling performed per USPSTF guidelines to reduce obesity and cardiovascular risk factors.     Please refer to Patient Instructions for  Blood Glucose Monitoring and Insulin/Medications Dosing Guide"  in media tab for additional information. Please  also refer to " Patient Self Inventory" in the Media  tab for reviewed elements of pertinent patient history.  Alex Taylor participated in the discussions, expressed understanding, and voiced agreement with the above plans.  All questions were answered to his  satisfaction. he is encouraged to contact clinic should he have any questions or concerns prior to his return visit.     Follow up plan: - Return in about 4 months (around 06/26/2022) for Diabetes F/U with A1c in office, No previsit labs, Bring meter and logs.   Rayetta Pigg, [facility] [address] [place], [place] [address] Phone: [phone] Fax: [phone]  02/25/2022, 9:02 AM

## 2022-04-08 ENCOUNTER — Other Ambulatory Visit (HOSPITAL_COMMUNITY): Payer: Self-pay | Admitting: Family Medicine

## 2022-04-08 DIAGNOSIS — E1165 Type 2 diabetes mellitus with hyperglycemia: Secondary | ICD-10-CM | POA: Diagnosis not present

## 2022-04-08 DIAGNOSIS — M25562 Pain in left knee: Secondary | ICD-10-CM | POA: Diagnosis not present

## 2022-04-08 DIAGNOSIS — R0989 Other specified symptoms and signs involving the circulatory and respiratory systems: Secondary | ICD-10-CM | POA: Diagnosis not present

## 2022-04-08 DIAGNOSIS — M25561 Pain in right knee: Secondary | ICD-10-CM | POA: Diagnosis not present

## 2022-04-22 ENCOUNTER — Ambulatory Visit (HOSPITAL_COMMUNITY)
Admission: RE | Admit: 2022-04-22 | Discharge: 2022-04-22 | Disposition: A | Discharge status: Home / Self Care | Payer: BC Managed Care – PPO | Source: Ambulatory Visit | Attending: Family Medicine | Admitting: Family Medicine

## 2022-04-22 DIAGNOSIS — E785 Hyperlipidemia, unspecified: Secondary | ICD-10-CM | POA: Diagnosis not present

## 2022-04-22 DIAGNOSIS — R0989 Other specified symptoms and signs involving the circulatory and respiratory systems: Secondary | ICD-10-CM | POA: Insufficient documentation

## 2022-07-01 ENCOUNTER — Ambulatory Visit: Payer: BC Managed Care – PPO | Admitting: Nurse Practitioner

## 2022-07-01 ENCOUNTER — Encounter: Payer: Self-pay | Admitting: Nurse Practitioner

## 2022-07-01 VITALS — BP 134/84 | HR 78 | Ht 67.0 in | Wt 213.8 lb

## 2022-07-01 DIAGNOSIS — E782 Mixed hyperlipidemia: Secondary | ICD-10-CM | POA: Diagnosis not present

## 2022-07-01 DIAGNOSIS — Z794 Long term (current) use of insulin: Secondary | ICD-10-CM | POA: Diagnosis not present

## 2022-07-01 DIAGNOSIS — Z7984 Long term (current) use of oral hypoglycemic drugs: Secondary | ICD-10-CM

## 2022-07-01 DIAGNOSIS — E119 Type 2 diabetes mellitus without complications: Secondary | ICD-10-CM

## 2022-07-01 DIAGNOSIS — I10 Essential (primary) hypertension: Secondary | ICD-10-CM

## 2022-07-01 LAB — POCT GLYCOSYLATED HEMOGLOBIN (HGB A1C): Hemoglobin A1C: 7.8 % — AB (ref 4.0–5.6)

## 2022-07-01 MED ORDER — TRESIBA FLEXTOUCH 100 UNIT/ML ~~LOC~~ SOPN: 25.0000 [IU] | PEN_INJECTOR | Freq: Every day | SUBCUTANEOUS | 3 refills | Status: DC

## 2022-07-01 NOTE — Progress Notes (Signed)
Endocrinology Follow Up Note       07/01/2022, 8:53 AM   Subjective:    Patient ID: Alex Taylor, male    DOB: [birth year].  Alex Taylor is being seen in follow up after being seen in consultation for management of currently uncontrolled symptomatic diabetes requested by  Gareth Morgan, MD.   Past Medical History:  Diagnosis Date   Diabetes mellitus without complication [facility])     Past Surgical History:  Procedure Laterality Date   BACK SURGERY     COLONOSCOPY  12/20/2011   Procedure: COLONOSCOPY;  Surgeon: Dalia Heading, MD;  Location: [facility];  Service: Gastroenterology;  Laterality: N/A;   KNEE ARTHROSCOPY     left knee    Social History   Socioeconomic History   Marital status: Married    Spouse name: Not on file   Number of children: Not on file   Years of education: Not on file   Highest education level: Not on file  Occupational History   Not on file  Tobacco Use   Smoking status: Former   Smokeless tobacco: Never  Vaping Use   Vaping Use: Never used  Substance and Sexual Activity   Alcohol use: Yes    Comment: occ   Drug use: No   Sexual activity: Not on file  Other Topics Concern   Not on file  Social History Narrative   Not on file   Social Determinants of Health   Financial Resource Strain: Not on file  Food Insecurity: Not on file  Transportation Needs: Not on file  Physical Activity: Not on file  Stress: Not on file  Social Connections: Not on file    Family History  Problem Relation Age of Onset   Cancer Mother    Osteoporosis Mother    Hypertension Father    Hyperlipidemia Father     Current Outpatient Medications on File Prior to Visit  Medication Sig Dispense Refill   glucose blood (ACCU-CHEK GUIDE) test strip Use as instructed to monitor glucose 2 times daily 100 each 12   HYDROcodone-acetaminophen (NORCO) 10-325 MG tablet Take 1 tablet up to 4 times  a day for severe pain (Do not fil before 4.8.22) 120 tablet 0   ibuprofen (ADVIL,MOTRIN) 200 MG tablet Take 200 mg by mouth every 6 (six) hours as needed. For pain     Insulin Pen Needle (PEN NEEDLES) 32G X 4 MM MISC Use to inject insulin once daily 100 each 3   losartan (COZAAR) 50 MG tablet TAKE 1 TABLET BY MOUTH EVERY DAY FOR BLOOD PRESSURE & PROTECTION OF THE KIDNEYS IN DIABETES 90 tablet 3   lovastatin (MEVACOR) 10 MG tablet Take 1 tablet (10 mg total) by mouth at bedtime. 60 tablet 0   metFORMIN (GLUCOPHAGE-XR) 500 MG 24 hr tablet Take 2 tablets (1,000 mg total) by mouth daily with breakfast. 180 tablet 3   sitaGLIPtin (JANUVIA) 100 MG tablet Take 1 tablet by mouth daily. 90 tablet 3   No current facility-administered medications on file prior to visit.      ALLERGIES: Allergies  Allergen Reactions   Codeine Other (See Comments)    "makes me go  stupid" per patient   Penicillins Other (See Comments)    Childhood reaction    VACCINATION STATUS:  There is no immunization history on file for this patient.  Diabetes He presents for his follow-up diabetic visit. He has type 2 diabetes mellitus. Onset time: diagnosed at approx age of 23. His disease course has been stable. There are no hypoglycemic associated symptoms. Pertinent negatives for diabetes include no weight loss. There are no hypoglycemic complications. Symptoms are stable. There are no diabetic complications. Risk factors for coronary artery disease include diabetes mellitus, dyslipidemia, male sex, obesity and hypertension. Current diabetic treatment includes oral agent (dual therapy) and insulin injections. He is compliant with treatment most of the time. His weight is fluctuating minimally. He is following a generally healthy diet. When asked about meal planning, he reported none. He has not had a previous visit with a dietitian. He participates in exercise intermittently. His breakfast blood glucose range is generally  140-180 mg/dl. His bedtime blood glucose range is generally 130-140 mg/dl. His overall blood glucose range is 140-180 mg/dl. (He presents today with his meter and logs showing slightly above target fasting and at goal postprandial readings.  His POCT A1c today is 7.8%, slightly worse from last visit of 7.5%.  Analysis of his meter shows 14-day average of 171.  He denies any hypoglycemia.  He does sometimes eat a snack before going to bed.) An ACE inhibitor/angiotensin II receptor blocker is being taken. He does not see a podiatrist.Eye exam is current.    Review of systems  Constitutional: + minimally fluctuating body weight,  current Body mass index is 33.49 kg/m. , no fatigue, no subjective hyperthermia, no subjective hypothermia Eyes: no blurry vision, no xerophthalmia ENT: no sore throat, no nodules palpated in throat, no dysphagia/odynophagia, no hoarseness Cardiovascular: no chest pain, no shortness of breath, no palpitations, no leg swelling Respiratory: no cough, no shortness of breath Gastrointestinal: no nausea/vomiting/diarrhea Musculoskeletal: no muscle/joint aches Skin: no rashes, no hyperemia Neurological: no tremors, no numbness, no tingling, no dizziness Psychiatric: no depression, no anxiety  Objective:     BP 134/84 (BP Location: Left Arm, Patient Position: Sitting, Cuff Size: Large)   Pulse 78   Ht 5\' 7"  (1.702 m)   Wt 213 lb 12.8 oz (97 kg)   BMI 33.49 kg/m   Wt Readings from Last 3 Encounters:  07/01/22 213 lb 12.8 oz (97 kg)  02/25/22 213 lb (96.6 kg)  10/15/21 206 lb 12.8 oz (93.8 kg)     BP Readings from Last 3 Encounters:  07/01/22 134/84  02/25/22 (!) 145/86  10/15/21 (!) 158/100     Physical Exam- Limited  Constitutional:  Body mass index is 33.49 kg/m. , not in acute distress, normal state of mind Eyes:  EOMI, no exophthalmos Musculoskeletal: no gross deformities, strength intact in all four extremities, no gross restriction of joint  movements Skin:  no rashes, no hyperemia Neurological: no tremor with outstretched hands   Diabetic Foot Exam - Simple   Simple Foot Form Diabetic Foot exam was performed with the following findings: Yes 07/01/2022  8:48 AM  Visual Inspection No deformities, no ulcerations, no other skin breakdown bilaterally: Yes Sensation Testing Intact to touch and monofilament testing bilaterally: Yes Pulse Check Posterior Tibialis and Dorsalis pulse intact bilaterally: Yes Comments     CMP ( most recent) CMP     Component Value Date/Time   NA 132 (L) 08/10/2021 0902   K 4.5 08/10/2021 0902   CL 94 (  L) 08/10/2021 0902   CO2 20 08/10/2021 0902   GLUCOSE 256 (H) 08/10/2021 0902   GLUCOSE 196 (H) 07/29/2019 0945   BUN 15 08/10/2021 0902   CREATININE 0.83 08/10/2021 0902   CALCIUM 9.1 08/10/2021 0902   PROT 6.7 08/10/2021 0902   ALBUMIN 4.3 08/10/2021 0902   AST 14 08/10/2021 0902   ALT 21 08/10/2021 0902   ALKPHOS 55 08/10/2021 0902   BILITOT 0.6 08/10/2021 0902   GFRNONAA 103 04/13/2020 0000   GFRAA >60 07/29/2019 0945     Diabetic Labs (most recent): Lab Results  Component Value Date   HGBA1C 7.8 (A) 07/01/2022   HGBA1C 7.5 (A) 02/25/2022   HGBA1C 7.6 08/20/2021     Lipid Panel ( most recent) Lipid Panel     Component Value Date/Time   CHOL 161 01/27/2021 0936   TRIG 137 01/27/2021 0936   HDL 57 01/27/2021 0936   CHOLHDL 2.8 01/27/2021 0936   LDLCALC 80 01/27/2021 0936   LABVLDL 24 01/27/2021 0936      Lab Results  Component Value Date   TSH 1.280 01/27/2021   FREET4 0.93 01/27/2021           Assessment & Plan:   1) Controlled Type 2 Diabetes without complication:  He presents today with his meter and logs showing slightly above target fasting and at goal postprandial readings.  His POCT A1c today is 7.8%, slightly worse from last visit of 7.5%.  Analysis of his meter shows 14-day average of 171.  He denies any hypoglycemia.  He does sometimes eat a snack  before going to bed.  - CODDY MANSIR has currently uncontrolled symptomatic type 2 DM since 62 years of age.  -Recent labs reviewed.  - I had a long discussion with him about the progressive nature of diabetes and the pathology behind its complications. -his diabetes is not currently complicated but he remains at a high risk for more acute and chronic complications which include CAD, CVA, CKD, retinopathy, and neuropathy. These are all discussed in detail with him.  The following Lifestyle Medicine recommendations according to American College of Lifestyle Medicine [facility]) were discussed and offered to patient and he agrees to start the journey:  A. Whole Foods, Plant-based plate comprising of fruits and vegetables, plant-based proteins, whole-grain carbohydrates was discussed in detail with the patient.   A list for source of those nutrients were also provided to the patient.  Patient will use only water or unsweetened tea for hydration. B.  The need to stay away from risky substances including alcohol, smoking; obtaining 7 to 9 hours of restorative sleep, at least 150 minutes of moderate intensity exercise weekly, the importance of healthy social connections,  and stress reduction techniques were discussed. C.  A full color page of  Calorie density of various food groups per pound showing examples of each food groups was provided to the patient.  - Nutritional counseling repeated at each appointment due to patients tendency to fall back in to old habits.  - The patient admits there is a room for improvement in their diet and drink choices. -  Suggestion is made for the patient to avoid simple carbohydrates from their diet including Cakes, Sweet Desserts / Pastries, Ice Cream, Soda (diet and regular), Sweet Tea, Candies, Chips, Cookies, Sweet Pastries, Store Bought Juices, Alcohol in Excess of 1-2 drinks a day, Artificial Sweeteners, Coffee Creamer, and "Sugar-free" Products. This will help patient  to have stable blood glucose profile and potentially  avoid unintended weight gain.   - I encouraged the patient to switch to unprocessed or minimally processed complex starch and increased protein intake (animal or plant source), fruits, and vegetables.   - Patient is advised to stick to a routine mealtimes to eat 3 meals a day and avoid unnecessary snacks (to snack only to correct hypoglycemia).  - I have approached him with the following individualized plan to manage his diabetes and patient agrees:   -I suspect he may be dumping during the night given his higher fasting readings.  Will lower his Tresiba to 25 units SQ nightly. He can continue Metformin 1000 mg ER daily with breakfast (to check his prescription bottle when he gets home), and Januvia 100 mg po daily.   -he is encouraged to continue monitoring blood glucose twice daily, before breakfast and before bed, and to call the clinic if he has readings less than 70 or greater than 300 for 3 tests in a row.  He could benefit from CGM device given insulin added to regimen.  He is afraid it wont stay on given his job, therefore I did give him sample Dexcom G7 at last visit to see if it is feasible.  He has not yet tried it but I urged him to try.  - Adjustment parameters are given to him for hypo and hyperglycemia in writing.  - He did not tolerate GLP1 due to nausea and vomiting.  - Specific targets for  A1c; LDL, HDL, and Triglycerides were discussed with the patient.  2) Blood Pressure /Hypertension:  his blood pressure is controlled to target.   he is advised to continue his current medications including Losartan 50 mg p.o. daily with breakfast.  3) Lipids/Hyperlipidemia:    Review of his recent lipid panel from 01/27/21 showed controlled LDL at 80.  he is advised to continue Lovastatin 10 mg daily at bedtime.  Side effects and precautions discussed with him.  Will recheck lipid panel prior to next visit.  4)  Weight/Diet:  his Body  mass index is 33.49 kg/m.  -  clearly complicating his diabetes care.   he is a candidate for weight loss.  I discussed with him the fact that loss of 5 - 10% of his  current body weight will have the most impact on his diabetes management.  Exercise, and detailed carbohydrates information provided  -  detailed on discharge instructions.    5) Chronic Care/Health Maintenance: -he is on ACEI/ARB and Statin medications and is encouraged to initiate and continue to follow up with Ophthalmology, Dentist, Podiatrist at least yearly or according to recommendations, and advised to stay away from smoking. I have recommended yearly flu vaccine and pneumonia vaccine at least every 5 years; moderate intensity exercise for up to 150 minutes weekly; and sleep for at least 7 hours a day.  - he is advised to maintain close follow up with Gareth Morgan, MD for primary care needs, as well as his other providers for optimal and coordinated care.     I spent  30  minutes in the care of the patient today including review of labs from CMP, Lipids, Thyroid Function, Hematology (current and previous including abstractions from other facilities); face-to-face time discussing  his blood glucose readings/logs, discussing hypoglycemia and hyperglycemia episodes and symptoms, medications doses, his options of short and long term treatment based on the latest standards of care / guidelines;  discussion about incorporating lifestyle medicine;  and documenting the encounter. Risk reduction counseling performed  per USPSTF guidelines to reduce obesity and cardiovascular risk factors.     Please refer to Patient Instructions for Blood Glucose Monitoring and Insulin/Medications Dosing Guide"  in media tab for additional information. Please  also refer to " Patient Self Inventory" in the Media  tab for reviewed elements of pertinent patient history.  Alex Taylor participated in the discussions, expressed understanding, and voiced  agreement with the above plans.  All questions were answered to his satisfaction. he is encouraged to contact clinic should he have any questions or concerns prior to his return visit.     Follow up plan: - Return in about 4 months (around 11/01/2022) for Diabetes F/U with A1c in office, Previsit labs, Bring meter and logs.   Ronny Bacon, [facility] [address] [place], [place] [address] Phone: [phone] Fax: [phone]  07/01/2022, 8:53 AM

## 2022-10-05 ENCOUNTER — Other Ambulatory Visit (HOSPITAL_COMMUNITY): Payer: Self-pay

## 2022-10-10 ENCOUNTER — Other Ambulatory Visit: Payer: Self-pay | Admitting: Nurse Practitioner

## 2022-11-04 ENCOUNTER — Ambulatory Visit: Payer: BC Managed Care – PPO | Admitting: Nurse Practitioner

## 2022-12-09 ENCOUNTER — Ambulatory Visit: Payer: Self-pay | Admitting: Nurse Practitioner

## 2022-12-09 DIAGNOSIS — E119 Type 2 diabetes mellitus without complications: Secondary | ICD-10-CM

## 2022-12-09 DIAGNOSIS — E782 Mixed hyperlipidemia: Secondary | ICD-10-CM

## 2022-12-09 DIAGNOSIS — Z794 Long term (current) use of insulin: Secondary | ICD-10-CM

## 2022-12-09 DIAGNOSIS — Z7984 Long term (current) use of oral hypoglycemic drugs: Secondary | ICD-10-CM

## 2022-12-26 ENCOUNTER — Telehealth: Payer: Self-pay | Admitting: Nurse Practitioner

## 2022-12-26 NOTE — Telephone Encounter (Signed)
Alex Taylor at [facility] called and said he has no insurance.   Can you make the RX for his Evaristo Bury up to so many units for it to be covered. The dose is 35 units. She said if not she understands or change the RX.

## 2022-12-27 MED ORDER — TRESIBA FLEXTOUCH 100 UNIT/ML ~~LOC~~ SOPN: 35.0000 [IU] | PEN_INJECTOR | Freq: Every day | SUBCUTANEOUS | 3 refills | Status: DC

## 2022-12-27 NOTE — Telephone Encounter (Signed)
I sent in a script for 35 units nightly to [facility].

## 2022-12-28 ENCOUNTER — Other Ambulatory Visit: Payer: Self-pay

## 2022-12-28 ENCOUNTER — Other Ambulatory Visit (HOSPITAL_COMMUNITY): Payer: Self-pay

## 2022-12-28 MED ORDER — TRESIBA FLEXTOUCH 100 UNIT/ML ~~LOC~~ SOPN
35.0000 [IU] | PEN_INJECTOR | Freq: Every day | SUBCUTANEOUS | 0 refills | Status: DC
  Filled 2022-12-29: qty 30, 85d supply, fill #0

## 2022-12-29 ENCOUNTER — Other Ambulatory Visit (HOSPITAL_COMMUNITY): Payer: Self-pay

## 2022-12-29 MED ORDER — INSUPEN PEN NEEDLES 32G X 4 MM MISC
0 refills | Status: DC
  Filled 2022-12-29: qty 100, 90d supply, fill #0

## 2022-12-29 MED FILL — Metformin HCl Tab ER 24HR 500 MG: ORAL | 90 days supply | Qty: 180 | Fill #0 | Status: AC

## 2022-12-30 ENCOUNTER — Other Ambulatory Visit (HOSPITAL_COMMUNITY): Payer: Self-pay

## 2023-01-05 ENCOUNTER — Other Ambulatory Visit (HOSPITAL_COMMUNITY): Payer: Self-pay

## 2023-01-09 ENCOUNTER — Other Ambulatory Visit (HOSPITAL_COMMUNITY): Payer: Self-pay

## 2023-01-10 ENCOUNTER — Other Ambulatory Visit (HOSPITAL_COMMUNITY): Payer: Self-pay

## 2023-01-10 ENCOUNTER — Other Ambulatory Visit: Payer: Self-pay

## 2023-01-10 MED ORDER — LOVASTATIN 10 MG PO TABS
10.0000 mg | ORAL_TABLET | Freq: Every day | ORAL | 3 refills | Status: DC
  Filled 2023-01-10: qty 90, 90d supply, fill #0

## 2023-01-10 MED ORDER — LOSARTAN POTASSIUM 50 MG PO TABS
50.0000 mg | ORAL_TABLET | Freq: Every day | ORAL | 3 refills | Status: DC
  Filled 2023-01-10: qty 90, 90d supply, fill #0

## 2023-01-11 ENCOUNTER — Other Ambulatory Visit (HOSPITAL_COMMUNITY): Payer: Self-pay

## 2023-01-13 DIAGNOSIS — E119 Type 2 diabetes mellitus without complications: Secondary | ICD-10-CM | POA: Diagnosis not present

## 2023-01-14 LAB — LIPID PANEL
Chol/HDL Ratio: 2.1 {ratio} (ref 0.0–5.0)
Cholesterol, Total: 122 mg/dL (ref 100–199)
HDL: 57 mg/dL (ref >39)
LDL Chol Calc (NIH): 53 mg/dL (ref 0–99)
Triglycerides: 54 mg/dL (ref 0–149)
VLDL Cholesterol Cal: 12 mg/dL (ref 5–40)

## 2023-01-14 LAB — COMPREHENSIVE METABOLIC PANEL
ALT: 17 [IU]/L (ref 0–44)
AST: 14 [IU]/L (ref 0–40)
Albumin: 4.3 g/dL (ref 3.9–4.9)
Alkaline Phosphatase: 67 [IU]/L (ref 44–121)
BUN/Creatinine Ratio: 14 (ref 10–24)
BUN: 12 mg/dL (ref 8–27)
Bilirubin Total: 0.7 mg/dL (ref 0.0–1.2)
CO2: 26 mmol/L (ref 20–29)
Calcium: 9 mg/dL (ref 8.6–10.2)
Chloride: 99 mmol/L (ref 96–106)
Creatinine, Ser: 0.87 mg/dL (ref 0.76–1.27)
Globulin, Total: 2.7 g/dL (ref 1.5–4.5)
Glucose: 135 mg/dL — ABNORMAL HIGH (ref 70–99)
Potassium: 4.9 mmol/L (ref 3.5–5.2)
Sodium: 138 mmol/L (ref 134–144)
Total Protein: 7 g/dL (ref 6.0–8.5)
eGFR: 98 mL/min/{1.73_m2} (ref >59)

## 2023-01-14 LAB — TSH: TSH: 0.709 u[IU]/mL (ref 0.450–4.500)

## 2023-01-14 LAB — VITAMIN D 25 HYDROXY (VIT D DEFICIENCY, FRACTURES): Vit D, 25-Hydroxy: 34.8 ng/mL (ref 30.0–100.0)

## 2023-01-14 LAB — T4, FREE: Free T4: 1 ng/dL (ref 0.82–1.77)

## 2023-01-18 ENCOUNTER — Ambulatory Visit: Payer: Self-pay | Admitting: Nurse Practitioner

## 2023-01-18 ENCOUNTER — Ambulatory Visit: Payer: Commercial Managed Care - PPO | Admitting: Nurse Practitioner

## 2023-01-18 ENCOUNTER — Encounter: Payer: Self-pay | Admitting: Nurse Practitioner

## 2023-01-18 ENCOUNTER — Other Ambulatory Visit (HOSPITAL_COMMUNITY): Payer: Self-pay

## 2023-01-18 VITALS — BP 131/76 | HR 105 | Ht 67.0 in | Wt 203.2 lb

## 2023-01-18 DIAGNOSIS — E782 Mixed hyperlipidemia: Secondary | ICD-10-CM

## 2023-01-18 DIAGNOSIS — Z794 Long term (current) use of insulin: Secondary | ICD-10-CM | POA: Diagnosis not present

## 2023-01-18 DIAGNOSIS — E119 Type 2 diabetes mellitus without complications: Secondary | ICD-10-CM | POA: Diagnosis not present

## 2023-01-18 DIAGNOSIS — Z7984 Long term (current) use of oral hypoglycemic drugs: Secondary | ICD-10-CM | POA: Diagnosis not present

## 2023-01-18 LAB — POCT GLYCOSYLATED HEMOGLOBIN (HGB A1C): Hemoglobin A1C: 9.8 % — AB (ref 4.0–5.6)

## 2023-01-18 MED ORDER — PEN NEEDLES 31G X 6 MM MISC
3 refills | Status: DC
  Filled 2023-01-18: qty 100, 100d supply, fill #0
  Filled 2023-08-21: qty 100, 90d supply, fill #0

## 2023-01-18 MED ORDER — METFORMIN HCL ER 500 MG PO TB24
1000.0000 mg | ORAL_TABLET | Freq: Every day | ORAL | 3 refills | Status: DC
  Filled 2023-01-18 – 2023-04-10 (×2): qty 180, 90d supply, fill #0
  Filled 2023-07-09: qty 180, 90d supply, fill #1

## 2023-01-18 MED ORDER — TRESIBA FLEXTOUCH 100 UNIT/ML ~~LOC~~ SOPN
35.0000 [IU] | PEN_INJECTOR | Freq: Every day | SUBCUTANEOUS | 3 refills | Status: DC
  Filled 2023-01-18: qty 33, 94d supply, fill #0
  Filled 2023-03-27: qty 30, 85d supply, fill #0
  Filled 2023-06-20 (×2): qty 30, 85d supply, fill #1

## 2023-01-18 MED ORDER — LOSARTAN POTASSIUM 50 MG PO TABS
50.0000 mg | ORAL_TABLET | Freq: Every day | ORAL | 0 refills | Status: DC
  Filled 2023-01-18 – 2023-04-10 (×2): qty 90, 90d supply, fill #0

## 2023-01-18 MED ORDER — SITAGLIPTIN PHOSPHATE 100 MG PO TABS
100.0000 mg | ORAL_TABLET | Freq: Every day | ORAL | 3 refills | Status: DC
  Filled 2023-01-18: qty 90, 90d supply, fill #0
  Filled 2023-01-20: qty 30, 30d supply, fill #0

## 2023-01-18 MED ORDER — LOVASTATIN 10 MG PO TABS
10.0000 mg | ORAL_TABLET | Freq: Every day | ORAL | 0 refills | Status: DC
  Filled 2023-01-18 – 2023-04-10 (×2): qty 90, 90d supply, fill #0

## 2023-01-18 NOTE — Progress Notes (Signed)
Endocrinology Follow Up Note       01/18/2023, 3:51 PM   Subjective:    Patient ID: Alex Taylor, male    DOB: [birth year].  Alex Taylor is being seen in follow up after being seen in consultation for management of currently uncontrolled symptomatic diabetes requested by  Pcp, No.   Past Medical History:  Diagnosis Date   Diabetes mellitus without complication [facility])     Past Surgical History:  Procedure Laterality Date   BACK SURGERY     COLONOSCOPY  12/20/2011   Procedure: COLONOSCOPY;  Surgeon: Dalia Heading, MD;  Location: [facility];  Service: Gastroenterology;  Laterality: N/A;   KNEE ARTHROSCOPY     left knee    Social History   Socioeconomic History   Marital status: Married    Spouse name: Not on file   Number of children: Not on file   Years of education: Not on file   Highest education level: Not on file  Occupational History   Not on file  Tobacco Use   Smoking status: Former   Smokeless tobacco: Never  Vaping Use   Vaping status: Never Used  Substance and Sexual Activity   Alcohol use: Yes    Comment: occ   Drug use: No   Sexual activity: Not on file  Other Topics Concern   Not on file  Social History Narrative   Not on file   Social Determinants of Health   Financial Resource Strain: Not on file  Food Insecurity: Not on file  Transportation Needs: Not on file  Physical Activity: Not on file  Stress: Not on file  Social Connections: Not on file    Family History  Problem Relation Age of Onset   Cancer Mother    Osteoporosis Mother    Hypertension Father    Hyperlipidemia Father     Current Outpatient Medications on File Prior to Visit  Medication Sig Dispense Refill   glucose blood (ACCU-CHEK GUIDE) test strip Use as instructed to monitor glucose 2 times daily 100 each 12   HYDROcodone-acetaminophen (NORCO) 10-325 MG tablet Take 1 tablet up to 4 times a day  for severe pain (Do not fil before 4.8.22) 120 tablet 0   ibuprofen (ADVIL,MOTRIN) 200 MG tablet Take 200 mg by mouth every 6 (six) hours as needed. For pain     No current facility-administered medications on file prior to visit.      ALLERGIES: Allergies  Allergen Reactions   Codeine Other (See Comments)    "makes me go stupid" per patient   Penicillins Other (See Comments)    Childhood reaction    VACCINATION STATUS:  There is no immunization history on file for this patient.  Diabetes He presents for his follow-up diabetic visit. He has type 2 diabetes mellitus. Onset time: diagnosed at approx age of 62. His disease course has been worsening. There are no hypoglycemic associated symptoms. Associated symptoms include weight loss. There are no hypoglycemic complications. Symptoms are stable. There are no diabetic complications. Risk factors for coronary artery disease include diabetes mellitus, dyslipidemia, male sex, obesity and hypertension. Current diabetic treatment includes oral agent (dual therapy) and insulin  injections. He is compliant with treatment most of the time. His weight is decreasing steadily. He is following a generally unhealthy diet. When asked about meal planning, he reported none. He has not had a previous visit with a dietitian. He participates in exercise intermittently. His home blood glucose trend is increasing steadily. His breakfast blood glucose range is generally >200 mg/dl. His bedtime blood glucose range is generally >200 mg/dl. His overall blood glucose range is >200 mg/dl. (He presents today with his meter and logs showing above target glycemic profile overall.  His POCT A1c today is 9.8%, increasing from last visit of 7.8%.  Analysis of his meter shows 14-day average of 222.  He denies any hypoglycemia.  He notes there was a period of time where he went without insurance.  He also notes he is looking for a new pcp as his retired.) An ACE inhibitor/angiotensin  II receptor blocker is being taken. He does not see a podiatrist.Eye exam is current.    Review of systems  Constitutional: + decreasing body weight,  current Body mass index is 31.83 kg/m. , no fatigue, no subjective hyperthermia, no subjective hypothermia Eyes: no blurry vision, no xerophthalmia ENT: no sore throat, no nodules palpated in throat, no dysphagia/odynophagia, no hoarseness Cardiovascular: no chest pain, no shortness of breath, no palpitations, no leg swelling Respiratory: no cough, no shortness of breath Gastrointestinal: no nausea/vomiting/diarrhea Musculoskeletal: no muscle/joint aches Skin: no rashes, no hyperemia Neurological: no tremors, no numbness, no tingling, no dizziness Psychiatric: no depression, no anxiety  Objective:     BP 131/76 (BP Location: Left Arm, Patient Position: Sitting, Cuff Size: Large)   Pulse (!) 105   Ht 5\' 7"  (1.702 m)   Wt 203 lb 3.2 oz (92.2 kg)   BMI 31.83 kg/m   Wt Readings from Last 3 Encounters:  01/18/23 203 lb 3.2 oz (92.2 kg)  07/01/22 213 lb 12.8 oz (97 kg)  02/25/22 213 lb (96.6 kg)     BP Readings from Last 3 Encounters:  01/18/23 131/76  07/01/22 134/84  02/25/22 (!) 145/86     Physical Exam- Limited  Constitutional:  Body mass index is 31.83 kg/m. , not in acute distress, normal state of mind Eyes:  EOMI, no exophthalmos Musculoskeletal: no gross deformities, strength intact in all four extremities, no gross restriction of joint movements Skin:  no rashes, no hyperemia Neurological: no tremor with outstretched hands   Diabetic Foot Exam - Simple   No data filed     CMP ( most recent) CMP     Component Value Date/Time   NA 138 01/13/2023 0846   K 4.9 01/13/2023 0846   CL 99 01/13/2023 0846   CO2 26 01/13/2023 0846   GLUCOSE 135 (H) 01/13/2023 0846   GLUCOSE 196 (H) 07/29/2019 0945   BUN 12 01/13/2023 0846   CREATININE 0.87 01/13/2023 0846   CALCIUM 9.0 01/13/2023 0846   PROT 7.0 01/13/2023 0846    ALBUMIN 4.3 01/13/2023 0846   AST 14 01/13/2023 0846   ALT 17 01/13/2023 0846   ALKPHOS 67 01/13/2023 0846   BILITOT 0.7 01/13/2023 0846   GFRNONAA 103 04/13/2020 0000   GFRAA >60 07/29/2019 0945     Diabetic Labs (most recent): Lab Results  Component Value Date   HGBA1C 9.8 (A) 01/18/2023   HGBA1C 7.8 (A) 07/01/2022   HGBA1C 7.5 (A) 02/25/2022     Lipid Panel ( most recent) Lipid Panel     Component Value Date/Time   CHOL  122 01/13/2023 0846   TRIG 54 01/13/2023 0846   HDL 57 01/13/2023 0846   CHOLHDL 2.1 01/13/2023 0846   LDLCALC 53 01/13/2023 0846   LABVLDL 12 01/13/2023 0846      Lab Results  Component Value Date   TSH 0.709 01/13/2023   TSH 1.280 01/27/2021   FREET4 1.00 01/13/2023   FREET4 0.93 01/27/2021           Assessment & Plan:   1) Controlled Type 2 Diabetes without complication:  He presents today with his meter and logs showing above target glycemic profile overall.  His POCT A1c today is 9.8%, increasing from last visit of 7.8%.  Analysis of his meter shows 14-day average of 222.  He denies any hypoglycemia.  He notes there was a period of time where he went without insurance.  He also notes he is looking for a new pcp as his retired.  - Alex Taylor has currently uncontrolled symptomatic type 2 DM since 62 years of age.  -Recent labs reviewed.  - I had a long discussion with him about the progressive nature of diabetes and the pathology behind its complications. -his diabetes is not currently complicated but he remains at a high risk for more acute and chronic complications which include CAD, CVA, CKD, retinopathy, and neuropathy. These are all discussed in detail with him.  The following Lifestyle Medicine recommendations according to American College of Lifestyle Medicine [facility]) were discussed and offered to patient and he agrees to start the journey:  A. Whole Foods, Plant-based plate comprising of fruits and vegetables, plant-based  proteins, whole-grain carbohydrates was discussed in detail with the patient.   A list for source of those nutrients were also provided to the patient.  Patient will use only water or unsweetened tea for hydration. B.  The need to stay away from risky substances including alcohol, smoking; obtaining 7 to 9 hours of restorative sleep, at least 150 minutes of moderate intensity exercise weekly, the importance of healthy social connections,  and stress reduction techniques were discussed. C.  A full color page of  Calorie density of various food groups per pound showing examples of each food groups was provided to the patient.  - Nutritional counseling repeated at each appointment due to patients tendency to fall back in to old habits.  - The patient admits there is a room for improvement in their diet and drink choices. -  Suggestion is made for the patient to avoid simple carbohydrates from their diet including Cakes, Sweet Desserts / Pastries, Ice Cream, Soda (diet and regular), Sweet Tea, Candies, Chips, Cookies, Sweet Pastries, Store Bought Juices, Alcohol in Excess of 1-2 drinks a day, Artificial Sweeteners, Coffee Creamer, and "Sugar-free" Products. This will help patient to have stable blood glucose profile and potentially avoid unintended weight gain.   - I encouraged the patient to switch to unprocessed or minimally processed complex starch and increased protein intake (animal or plant source), fruits, and vegetables.   - Patient is advised to stick to a routine mealtimes to eat 3 meals a day and avoid unnecessary snacks (to snack only to correct hypoglycemia).  - I have approached him with the following individualized plan to manage his diabetes and patient agrees:   -He is advised to increase Tresiba to 35 units SQ nightly, continue Metformin 1000 mg ER daily and Januvia 100 mg po daily.  -he is encouraged to continue monitoring blood glucose twice daily, before breakfast and before bed,  and  to call the clinic if he has readings less than 70 or greater than 300 for 3 tests in a row.  He could benefit from CGM device given insulin added to regimen.  He is afraid it wont stay on given his job, therefore I did give him sample Dexcom G7 at last visit to see if it is feasible.  He has not yet tried it but I urged him to try.  - Adjustment parameters are given to him for hypo and hyperglycemia in writing.  - He did not tolerate GLP1 due to nausea and vomiting.  - Specific targets for  A1c; LDL, HDL, and Triglycerides were discussed with the patient.  2) Blood Pressure /Hypertension:  his blood pressure is controlled to target.   he is advised to continue his current medications including Losartan 50 mg p.o. daily with breakfast.  3) Lipids/Hyperlipidemia:    Review of his recent lipid panel from 01/13/23 showed controlled LDL at 53.  he is advised to continue Lovastatin 10 mg daily at bedtime.  Side effects and precautions discussed with him.    4)  Weight/Diet:  his Body mass index is 31.83 kg/m.  -  clearly complicating his diabetes care.   he is a candidate for weight loss.  I discussed with him the fact that loss of 5 - 10% of his  current body weight will have the most impact on his diabetes management.  Exercise, and detailed carbohydrates information provided  -  detailed on discharge instructions.    5) Chronic Care/Health Maintenance: -he is on ACEI/ARB and Statin medications and is encouraged to initiate and continue to follow up with Ophthalmology, Dentist, Podiatrist at least yearly or according to recommendations, and advised to stay away from smoking. I have recommended yearly flu vaccine and pneumonia vaccine at least every 5 years; moderate intensity exercise for up to 150 minutes weekly; and sleep for at least 7 hours a day.  - he is advised to maintain close follow up with Pcp, No for primary care needs, as well as his other providers for optimal and coordinated  care.     I spent  39  minutes in the care of the patient today including review of labs from CMP, Lipids, Thyroid Function, Hematology (current and previous including abstractions from other facilities); face-to-face time discussing  his blood glucose readings/logs, discussing hypoglycemia and hyperglycemia episodes and symptoms, medications doses, his options of short and long term treatment based on the latest standards of care / guidelines;  discussion about incorporating lifestyle medicine;  and documenting the encounter. Risk reduction counseling performed per USPSTF guidelines to reduce obesity and cardiovascular risk factors.     Please refer to Patient Instructions for Blood Glucose Monitoring and Insulin/Medications Dosing Guide"  in media tab for additional information. Please  also refer to " Patient Self Inventory" in the Media  tab for reviewed elements of pertinent patient history.  Alex Taylor participated in the discussions, expressed understanding, and voiced agreement with the above plans.  All questions were answered to his satisfaction. he is encouraged to contact clinic should he have any questions or concerns prior to his return visit.     Follow up plan: - Return in about 3 months (around 04/20/2023) for Diabetes F/U with A1c in office, No previsit labs, Bring meter and logs.   Alex Taylor, [facility] [address] [place], [place] [address] Phone: [phone] Fax: [phone]  01/18/2023, 3:51 PM

## 2023-01-20 ENCOUNTER — Other Ambulatory Visit: Payer: Self-pay

## 2023-03-27 ENCOUNTER — Other Ambulatory Visit (HOSPITAL_BASED_OUTPATIENT_CLINIC_OR_DEPARTMENT_OTHER): Payer: Self-pay

## 2023-03-27 ENCOUNTER — Other Ambulatory Visit (HOSPITAL_COMMUNITY): Payer: Self-pay

## 2023-04-10 ENCOUNTER — Other Ambulatory Visit: Payer: Self-pay

## 2023-04-10 ENCOUNTER — Other Ambulatory Visit (HOSPITAL_COMMUNITY): Payer: Self-pay

## 2023-04-11 ENCOUNTER — Other Ambulatory Visit (HOSPITAL_COMMUNITY): Payer: Self-pay

## 2023-04-14 ENCOUNTER — Ambulatory Visit: Payer: Self-pay | Admitting: Nurse Practitioner

## 2023-04-21 ENCOUNTER — Other Ambulatory Visit: Payer: Self-pay

## 2023-04-21 ENCOUNTER — Other Ambulatory Visit (HOSPITAL_BASED_OUTPATIENT_CLINIC_OR_DEPARTMENT_OTHER): Payer: Self-pay

## 2023-04-21 ENCOUNTER — Other Ambulatory Visit (HOSPITAL_COMMUNITY): Payer: Self-pay

## 2023-04-21 MED ORDER — LOSARTAN POTASSIUM 50 MG PO TABS
50.0000 mg | ORAL_TABLET | Freq: Every day | ORAL | 3 refills | Status: AC
  Filled 2023-04-21 – 2023-07-09 (×2): qty 90, 90d supply, fill #0
  Filled 2023-10-09: qty 90, 90d supply, fill #1
  Filled 2024-01-04: qty 90, 90d supply, fill #2
  Filled 2024-03-25: qty 90, 90d supply, fill #3

## 2023-04-21 MED ORDER — HYDROCODONE-ACETAMINOPHEN 10-325 MG PO TABS
1.0000 | ORAL_TABLET | Freq: Four times a day (QID) | ORAL | 0 refills | Status: DC | PRN
  Filled 2023-04-21: qty 120, 30d supply, fill #0

## 2023-04-21 MED ORDER — LOVASTATIN 10 MG PO TABS
10.0000 mg | ORAL_TABLET | Freq: Every evening | ORAL | 3 refills | Status: AC
  Filled 2023-04-21 – 2023-07-09 (×2): qty 90, 90d supply, fill #0
  Filled 2023-10-09: qty 90, 90d supply, fill #1
  Filled 2024-01-04: qty 90, 90d supply, fill #2
  Filled 2024-03-25: qty 90, 90d supply, fill #3

## 2023-04-24 ENCOUNTER — Other Ambulatory Visit (HOSPITAL_COMMUNITY): Payer: Self-pay

## 2023-04-28 ENCOUNTER — Ambulatory Visit: Payer: Commercial Managed Care - PPO | Admitting: Nurse Practitioner

## 2023-04-28 ENCOUNTER — Encounter: Payer: Self-pay | Admitting: Nurse Practitioner

## 2023-04-28 VITALS — BP 130/82 | HR 58 | Ht 67.0 in | Wt 211.6 lb

## 2023-04-28 DIAGNOSIS — Z7984 Long term (current) use of oral hypoglycemic drugs: Secondary | ICD-10-CM | POA: Diagnosis not present

## 2023-04-28 DIAGNOSIS — Z794 Long term (current) use of insulin: Secondary | ICD-10-CM | POA: Diagnosis not present

## 2023-04-28 DIAGNOSIS — E119 Type 2 diabetes mellitus without complications: Secondary | ICD-10-CM

## 2023-04-28 DIAGNOSIS — E782 Mixed hyperlipidemia: Secondary | ICD-10-CM

## 2023-04-28 LAB — POCT GLYCOSYLATED HEMOGLOBIN (HGB A1C): Hemoglobin A1C: 8.5 % — AB (ref 4.0–5.6)

## 2023-04-28 NOTE — Progress Notes (Signed)
 Endocrinology Follow Up Note       04/28/2023, 8:13 AM   Subjective:    Patient ID: Alex Taylor, male    DOB: [birth year].  Alex Taylor is being seen in follow up after being seen in consultation for management of currently uncontrolled symptomatic diabetes requested by  Pcp, No.   Past Medical History:  Diagnosis Date   Diabetes mellitus without complication [facility])     Past Surgical History:  Procedure Laterality Date   BACK SURGERY     COLONOSCOPY  12/20/2011   Procedure: COLONOSCOPY;  Surgeon: Dalia Heading, MD;  Location: [facility];  Service: Gastroenterology;  Laterality: N/A;   KNEE ARTHROSCOPY     left knee    Social History   Socioeconomic History   Marital status: Married    Spouse name: Not on file   Number of children: Not on file   Years of education: Not on file   Highest education level: Not on file  Occupational History   Not on file  Tobacco Use   Smoking status: Former   Smokeless tobacco: Never  Vaping Use   Vaping status: Never Used  Substance and Sexual Activity   Alcohol use: Yes    Comment: occ   Drug use: No   Sexual activity: Not on file  Other Topics Concern   Not on file  Social History Narrative   Not on file   Social Drivers of Health   Financial Resource Strain: Not on file  Food Insecurity: Not on file  Transportation Needs: Not on file  Physical Activity: Not on file  Stress: Not on file  Social Connections: Not on file    Family History  Problem Relation Age of Onset   Cancer Mother    Osteoporosis Mother    Hypertension Father    Hyperlipidemia Father     Current Outpatient Medications on File Prior to Visit  Medication Sig Dispense Refill   glucose blood (ACCU-CHEK GUIDE) test strip Use as instructed to monitor glucose 2 times daily 100 each 12   HYDROcodone-acetaminophen (NORCO) 10-325 MG tablet Take 1 tablet up to 4 times a day for  severe pain (Do not fil before 4.8.22) 120 tablet 0   HYDROcodone-acetaminophen (NORCO) 10-325 MG tablet Take 1 tablet by mouth 4 (four) times daily as needed. 120 tablet 0   ibuprofen (ADVIL,MOTRIN) 200 MG tablet Take 200 mg by mouth every 6 (six) hours as needed. For pain     insulin degludec (TRESIBA FLEXTOUCH) 100 UNIT/ML FlexTouch Pen Inject 35 Units into the skin at bedtime. 33 mL 3   Insulin Pen Needle (PEN NEEDLES) 31G X 6 MM MISC Use to inject insulin once daily 100 each 3   losartan (COZAAR) 50 MG tablet Take 1 tablet (50 mg total) by mouth daily. 90 tablet 3   lovastatin (MEVACOR) 10 MG tablet Take 1 tablet (10 mg total) by mouth every evening. 90 tablet 3   metFORMIN (GLUCOPHAGE-XR) 500 MG 24 hr tablet Take 2 tablets (1,000 mg total) by mouth daily with breakfast. 180 tablet 3   sitaGLIPtin (JANUVIA) 100 MG tablet Take 1 tablet by mouth daily. 90 tablet 3  No current facility-administered medications on file prior to visit.      ALLERGIES: Allergies  Allergen Reactions   Codeine Other (See Comments)    "makes me go stupid" per patient   Penicillins Other (See Comments)    Childhood reaction    VACCINATION STATUS:  There is no immunization history on file for this patient.  Diabetes He presents for his follow-up diabetic visit. He has type 2 diabetes mellitus. Onset time: diagnosed at approx age of 30. His disease course has been improving. There are no hypoglycemic associated symptoms. Pertinent negatives for diabetes include no weight loss. There are no hypoglycemic complications. Symptoms are stable. There are no diabetic complications. Risk factors for coronary artery disease include diabetes mellitus, dyslipidemia, male sex, obesity and hypertension. Current diabetic treatment includes oral agent (dual therapy) and insulin injections. He is compliant with treatment most of the time. His weight is fluctuating minimally. He is following a generally unhealthy diet. When  asked about meal planning, he reported none. He has not had a previous visit with a dietitian. He participates in exercise intermittently. His home blood glucose trend is decreasing steadily. His overall blood glucose range is 140-180 mg/dl. (He presents today with his meter and logs showing mostly at target glycemic profile overall.  His POCT A1c today is 8.5%, improving from last visit of 9.8%.  Analysis of his meter shows 7-day average of 179, 14-day average of 178, 30-day average of 171.  He notes he did have a head cold and glucose went up briefly.  He has plans to exercise more now that the weather is warming up.) An ACE inhibitor/angiotensin II receptor blocker is being taken. He does not see a podiatrist.Eye exam is current.    Review of systems  Constitutional: + decreasing body weight,  current Body mass index is 33.14 kg/m. , no fatigue, no subjective hyperthermia, no subjective hypothermia Eyes: no blurry vision, no xerophthalmia ENT: no sore throat, no nodules palpated in throat, no dysphagia/odynophagia, no hoarseness Cardiovascular: no chest pain, no shortness of breath, no palpitations, no leg swelling Respiratory: no cough, no shortness of breath Gastrointestinal: no nausea/vomiting/diarrhea Musculoskeletal: no muscle/joint aches Skin: no rashes, no hyperemia Neurological: no tremors, no numbness, no tingling, no dizziness Psychiatric: no depression, no anxiety  Objective:     BP 130/82 (BP Location: Left Arm, Patient Position: Sitting, Cuff Size: Large)   Pulse (!) 58   Ht 5\' 7"  (1.702 m)   Wt 211 lb 9.6 oz (96 kg)   BMI 33.14 kg/m   Wt Readings from Last 3 Encounters:  04/28/23 211 lb 9.6 oz (96 kg)  01/18/23 203 lb 3.2 oz (92.2 kg)  07/01/22 213 lb 12.8 oz (97 kg)     BP Readings from Last 3 Encounters:  04/28/23 130/82  01/18/23 131/76  07/01/22 134/84      Physical Exam- Limited  Constitutional:  Body mass index is 33.14 kg/m. , not in acute distress,  normal state of mind Eyes:  EOMI, no exophthalmos Musculoskeletal: no gross deformities, strength intact in all four extremities, no gross restriction of joint movements Skin:  no rashes, no hyperemia Neurological: no tremor with outstretched hands   Diabetic Foot Exam - Simple   No data filed     CMP ( most recent) CMP     Component Value Date/Time   NA 138 01/13/2023 0846   K 4.9 01/13/2023 0846   CL 99 01/13/2023 0846   CO2 26 01/13/2023 0846   GLUCOSE  135 (H) 01/13/2023 0846   GLUCOSE 196 (H) 07/29/2019 0945   BUN 12 01/13/2023 0846   CREATININE 0.87 01/13/2023 0846   CALCIUM 9.0 01/13/2023 0846   PROT 7.0 01/13/2023 0846   ALBUMIN 4.3 01/13/2023 0846   AST 14 01/13/2023 0846   ALT 17 01/13/2023 0846   ALKPHOS 67 01/13/2023 0846   BILITOT 0.7 01/13/2023 0846   GFRNONAA 103 04/13/2020 0000   GFRAA >60 07/29/2019 0945     Diabetic Labs (most recent): Lab Results  Component Value Date   HGBA1C 8.5 (A) 04/28/2023   HGBA1C 9.8 (A) 01/18/2023   HGBA1C 7.8 (A) 07/01/2022     Lipid Panel ( most recent) Lipid Panel     Component Value Date/Time   CHOL 122 01/13/2023 0846   TRIG 54 01/13/2023 0846   HDL 57 01/13/2023 0846   CHOLHDL 2.1 01/13/2023 0846   LDLCALC 53 01/13/2023 0846   LABVLDL 12 01/13/2023 0846      Lab Results  Component Value Date   TSH 0.709 01/13/2023   TSH 1.280 01/27/2021   FREET4 1.00 01/13/2023   FREET4 0.93 01/27/2021           Assessment & Plan:   1) Controlled Type 2 Diabetes without complication:  He presents today with his meter and logs showing mostly at target glycemic profile overall.  His POCT A1c today is 8.5%, improving from last visit of 9.8%.  Analysis of his meter shows 7-day average of 179, 14-day average of 178, 30-day average of 171.  He notes he did have a head cold and glucose went up briefly.  He has plans to exercise more now that the weather is warming up.  - BLANCA THORNTON has currently uncontrolled  symptomatic type 2 DM since 63 years of age.  -Recent labs reviewed.  - I had a long discussion with him about the progressive nature of diabetes and the pathology behind its complications. -his diabetes is not currently complicated but he remains at a high risk for more acute and chronic complications which include CAD, CVA, CKD, retinopathy, and neuropathy. These are all discussed in detail with him.  The following Lifestyle Medicine recommendations according to American College of Lifestyle Medicine [facility]) were discussed and offered to patient and he agrees to start the journey:  A. Whole Foods, Plant-based plate comprising of fruits and vegetables, plant-based proteins, whole-grain carbohydrates was discussed in detail with the patient.   A list for source of those nutrients were also provided to the patient.  Patient will use only water or unsweetened tea for hydration. B.  The need to stay away from risky substances including alcohol, smoking; obtaining 7 to 9 hours of restorative sleep, at least 150 minutes of moderate intensity exercise weekly, the importance of healthy social connections,  and stress reduction techniques were discussed. C.  A full color page of  Calorie density of various food groups per pound showing examples of each food groups was provided to the patient.  - Nutritional counseling repeated at each appointment due to patients tendency to fall back in to old habits.  - The patient admits there is a room for improvement in their diet and drink choices. -  Suggestion is made for the patient to avoid simple carbohydrates from their diet including Cakes, Sweet Desserts / Pastries, Ice Cream, Soda (diet and regular), Sweet Tea, Candies, Chips, Cookies, Sweet Pastries, Store Bought Juices, Alcohol in Excess of 1-2 drinks a day, Artificial Sweeteners, Coffee Creamer, and "  Sugar-free" Products. This will help patient to have stable blood glucose profile and potentially avoid  unintended weight gain.   - I encouraged the patient to switch to unprocessed or minimally processed complex starch and increased protein intake (animal or plant source), fruits, and vegetables.   - Patient is advised to stick to a routine mealtimes to eat 3 meals a day and avoid unnecessary snacks (to snack only to correct hypoglycemia).  - I have approached him with the following individualized plan to manage his diabetes and patient agrees:   -He is advised to continue Tresiba 35 units SQ nightly, Metformin 1000 mg ER daily and Januvia 100 mg po daily.  -he is encouraged to continue monitoring blood glucose twice daily, before breakfast and before bed, and to call the clinic if he has readings less than 70 or greater than 300 for 3 tests in a row.  He could benefit from CGM device given insulin added to regimen, but he prefers to perform traditional fingersticks.  - Adjustment parameters are given to him for hypo and hyperglycemia in writing.  - He did not tolerate GLP1 due to nausea and vomiting.  - Specific targets for  A1c; LDL, HDL, and Triglycerides were discussed with the patient.  2) Blood Pressure /Hypertension:  his blood pressure is controlled to target.   he is advised to continue his current medications including Losartan 50 mg p.o. daily with breakfast.  3) Lipids/Hyperlipidemia:    Review of his recent lipid panel from 01/13/23 showed controlled LDL at 53.  he is advised to continue Lovastatin 10 mg daily at bedtime.  Side effects and precautions discussed with him.    4)  Weight/Diet:  his Body mass index is 33.14 kg/m.  -  clearly complicating his diabetes care.   he is a candidate for weight loss.  I discussed with him the fact that loss of 5 - 10% of his  current body weight will have the most impact on his diabetes management.  Exercise, and detailed carbohydrates information provided  -  detailed on discharge instructions.    5) Chronic Care/Health Maintenance: -he  is on ACEI/ARB and Statin medications and is encouraged to initiate and continue to follow up with Ophthalmology, Dentist, Podiatrist at least yearly or according to recommendations, and advised to stay away from smoking. I have recommended yearly flu vaccine and pneumonia vaccine at least every 5 years; moderate intensity exercise for up to 150 minutes weekly; and sleep for at least 7 hours a day.  - he is advised to maintain close follow up with Pcp, No for primary care needs, as well as his other providers for optimal and coordinated care.     I spent  20  minutes in the care of the patient today including review of labs from CMP, Lipids, Thyroid Function, Hematology (current and previous including abstractions from other facilities); face-to-face time discussing  his blood glucose readings/logs, discussing hypoglycemia and hyperglycemia episodes and symptoms, medications doses, his options of short and long term treatment based on the latest standards of care / guidelines;  discussion about incorporating lifestyle medicine;  and documenting the encounter. Risk reduction counseling performed per USPSTF guidelines to reduce obesity and cardiovascular risk factors.     Please refer to Patient Instructions for Blood Glucose Monitoring and Insulin/Medications Dosing Guide"  in media tab for additional information. Please  also refer to " Patient Self Inventory" in the Media  tab for reviewed elements of pertinent patient history.  Alex Taylor participated in the discussions, expressed understanding, and voiced agreement with the above plans.  All questions were answered to his satisfaction. he is encouraged to contact clinic should he have any questions or concerns prior to his return visit.     Follow up plan: - Return in about 4 months (around 08/28/2023) for Diabetes F/U with A1c in office, No previsit labs, Bring meter and logs.   Ronny Bacon, [facility] [address] [place], [place] [address] Phone: [phone] Fax: [phone]  04/28/2023, 8:13 AM

## 2023-05-03 ENCOUNTER — Encounter: Payer: Self-pay | Admitting: *Deleted

## 2023-05-08 ENCOUNTER — Other Ambulatory Visit (HOSPITAL_COMMUNITY): Payer: Self-pay

## 2023-06-19 DIAGNOSIS — M25561 Pain in right knee: Secondary | ICD-10-CM | POA: Diagnosis not present

## 2023-06-20 ENCOUNTER — Encounter: Payer: Self-pay | Admitting: Medical

## 2023-06-20 ENCOUNTER — Ambulatory Visit: Admitting: Orthopaedic Surgery

## 2023-06-20 ENCOUNTER — Other Ambulatory Visit (HOSPITAL_COMMUNITY): Payer: Self-pay

## 2023-06-20 ENCOUNTER — Other Ambulatory Visit: Payer: Self-pay | Admitting: Medical

## 2023-06-20 ENCOUNTER — Other Ambulatory Visit: Payer: Self-pay

## 2023-06-20 DIAGNOSIS — M25561 Pain in right knee: Secondary | ICD-10-CM

## 2023-06-22 ENCOUNTER — Other Ambulatory Visit (HOSPITAL_COMMUNITY): Payer: Self-pay

## 2023-06-30 ENCOUNTER — Ambulatory Visit
Admission: RE | Admit: 2023-06-30 | Discharge: 2023-06-30 | Disposition: A | Discharge status: Home / Self Care | Source: Ambulatory Visit | Attending: Medical | Admitting: Medical

## 2023-06-30 DIAGNOSIS — M1711 Unilateral primary osteoarthritis, right knee: Secondary | ICD-10-CM | POA: Diagnosis not present

## 2023-06-30 DIAGNOSIS — M25461 Effusion, right knee: Secondary | ICD-10-CM | POA: Diagnosis not present

## 2023-06-30 DIAGNOSIS — M25561 Pain in right knee: Secondary | ICD-10-CM

## 2023-06-30 DIAGNOSIS — M23331 Other meniscus derangements, other medial meniscus, right knee: Secondary | ICD-10-CM | POA: Diagnosis not present

## 2023-07-07 DIAGNOSIS — M23203 Derangement of unspecified medial meniscus due to old tear or injury, right knee: Secondary | ICD-10-CM | POA: Diagnosis not present

## 2023-07-07 DIAGNOSIS — M1711 Unilateral primary osteoarthritis, right knee: Secondary | ICD-10-CM | POA: Diagnosis not present

## 2023-07-07 DIAGNOSIS — E119 Type 2 diabetes mellitus without complications: Secondary | ICD-10-CM | POA: Diagnosis not present

## 2023-07-10 ENCOUNTER — Other Ambulatory Visit (HOSPITAL_COMMUNITY): Payer: Self-pay

## 2023-07-12 ENCOUNTER — Other Ambulatory Visit (HOSPITAL_COMMUNITY): Payer: Self-pay

## 2023-07-14 ENCOUNTER — Other Ambulatory Visit (HOSPITAL_COMMUNITY): Payer: Self-pay

## 2023-07-18 DIAGNOSIS — M25561 Pain in right knee: Secondary | ICD-10-CM | POA: Diagnosis not present

## 2023-08-04 DIAGNOSIS — M170 Bilateral primary osteoarthritis of knee: Secondary | ICD-10-CM | POA: Diagnosis not present

## 2023-08-04 DIAGNOSIS — M1711 Unilateral primary osteoarthritis, right knee: Secondary | ICD-10-CM | POA: Diagnosis not present

## 2023-08-11 DIAGNOSIS — M1711 Unilateral primary osteoarthritis, right knee: Secondary | ICD-10-CM | POA: Diagnosis not present

## 2023-08-18 DIAGNOSIS — M170 Bilateral primary osteoarthritis of knee: Secondary | ICD-10-CM | POA: Diagnosis not present

## 2023-08-21 ENCOUNTER — Other Ambulatory Visit (HOSPITAL_COMMUNITY): Payer: Self-pay

## 2023-09-01 ENCOUNTER — Telehealth: Payer: Self-pay | Admitting: Nurse Practitioner

## 2023-09-01 ENCOUNTER — Other Ambulatory Visit (HOSPITAL_COMMUNITY): Payer: Self-pay

## 2023-09-01 ENCOUNTER — Ambulatory Visit: Admitting: Nurse Practitioner

## 2023-09-01 ENCOUNTER — Encounter: Payer: Self-pay | Admitting: Pharmacist

## 2023-09-01 ENCOUNTER — Other Ambulatory Visit: Payer: Self-pay

## 2023-09-01 ENCOUNTER — Encounter: Payer: Self-pay | Admitting: Nurse Practitioner

## 2023-09-01 VITALS — BP 142/90 | HR 84 | Ht 67.0 in | Wt 209.6 lb

## 2023-09-01 DIAGNOSIS — Z7984 Long term (current) use of oral hypoglycemic drugs: Secondary | ICD-10-CM | POA: Diagnosis not present

## 2023-09-01 DIAGNOSIS — E782 Mixed hyperlipidemia: Secondary | ICD-10-CM | POA: Diagnosis not present

## 2023-09-01 DIAGNOSIS — Z794 Long term (current) use of insulin: Secondary | ICD-10-CM

## 2023-09-01 DIAGNOSIS — E119 Type 2 diabetes mellitus without complications: Secondary | ICD-10-CM | POA: Diagnosis not present

## 2023-09-01 DIAGNOSIS — M170 Bilateral primary osteoarthritis of knee: Secondary | ICD-10-CM | POA: Diagnosis not present

## 2023-09-01 MED ORDER — HUMALOG KWIKPEN 200 UNIT/ML ~~LOC~~ SOPN
5.0000 [IU] | PEN_INJECTOR | Freq: Three times a day (TID) | SUBCUTANEOUS | 3 refills | Status: AC
  Filled 2023-09-01 (×2): qty 30, 90d supply, fill #0

## 2023-09-01 MED ORDER — SITAGLIPTIN PHOSPHATE 100 MG PO TABS
100.0000 mg | ORAL_TABLET | Freq: Every day | ORAL | 3 refills | Status: DC
  Filled 2023-09-01: qty 90, 90d supply, fill #0

## 2023-09-01 MED ORDER — TRESIBA FLEXTOUCH 100 UNIT/ML ~~LOC~~ SOPN
35.0000 [IU] | PEN_INJECTOR | Freq: Every day | SUBCUTANEOUS | 3 refills | Status: DC
  Filled 2023-09-01 – 2023-09-25 (×3): qty 30, 85d supply, fill #0

## 2023-09-01 MED ORDER — PEN NEEDLES 31G X 6 MM MISC
3 refills | Status: AC
  Filled 2023-09-01: qty 300, fill #0
  Filled 2024-01-22: qty 300, 75d supply, fill #0

## 2023-09-01 MED ORDER — METFORMIN HCL ER 500 MG PO TB24
1000.0000 mg | ORAL_TABLET | Freq: Every day | ORAL | 3 refills | Status: AC
  Filled 2023-09-01: qty 180, 90d supply, fill #0

## 2023-09-01 NOTE — Progress Notes (Signed)
 Endocrinology Follow Up Note       09/01/2023, 8:50 AM   Subjective:    Patient ID: Alex Taylor, male    DOB: 01/10/1961.  Alex Taylor is being seen in follow up after being seen in consultation for management of currently uncontrolled symptomatic diabetes requested by  Dow Longs, PA-C.   Past Medical History:  Diagnosis Date   Diabetes mellitus without complication [facility])     Past Surgical History:  Procedure Laterality Date   BACK SURGERY     COLONOSCOPY  12/20/2011   Procedure: COLONOSCOPY;  Surgeon: Oneil DELENA Budge, MD;  Location: [facility];  Service: Gastroenterology;  Laterality: N/A;   KNEE ARTHROSCOPY     left knee    Social History   Socioeconomic History   Marital status: Married    Spouse name: Not on file   Number of children: Not on file   Years of education: Not on file   Highest education level: Not on file  Occupational History   Not on file  Tobacco Use   Smoking status: Former   Smokeless tobacco: Never  Vaping Use   Vaping status: Never Used  Substance and Sexual Activity   Alcohol use: Yes    Comment: occ   Drug use: No   Sexual activity: Not on file  Other Topics Concern   Not on file  Social History Narrative   Not on file   Social Drivers of Health   Financial Resource Strain: Not on file  Food Insecurity: Not on file  Transportation Needs: Not on file  Physical Activity: Not on file  Stress: Not on file  Social Connections: Not on file    Family History  Problem Relation Age of Onset   Cancer Mother    Osteoporosis Mother    Hypertension Father    Hyperlipidemia Father     Current Outpatient Medications on File Prior to Visit  Medication Sig Dispense Refill   glucose blood (ACCU-CHEK GUIDE) test strip Use as instructed to monitor glucose 2 times daily 100 each 12   HYDROcodone -acetaminophen  (NORCO) 10-325 MG tablet Take 1 tablet up to 4 times a  day for severe pain (Do not fil before 4.8.22) 120 tablet 0   HYDROcodone -acetaminophen  (NORCO) 10-325 MG tablet Take 1 tablet by mouth 4 (four) times daily as needed. 120 tablet 0   ibuprofen  (ADVIL ,MOTRIN ) 200 MG tablet Take 200 mg by mouth every 6 (six) hours as needed. For pain     losartan  (COZAAR ) 50 MG tablet Take 1 tablet (50 mg total) by mouth daily. 90 tablet 3   lovastatin  (MEVACOR ) 10 MG tablet Take 1 tablet (10 mg total) by mouth every evening. 90 tablet 3   No current facility-administered medications on file prior to visit.      ALLERGIES: Allergies  Allergen Reactions   Codeine Other (See Comments)    makes me go stupid per patient   Penicillins Other (See Comments)    Childhood reaction    VACCINATION STATUS:  There is no immunization history on file for this patient.  Diabetes He presents for his follow-up diabetic visit. He has type 2 diabetes mellitus. Onset time:  diagnosed at approx age of 89. His disease course has been worsening. There are no hypoglycemic associated symptoms. Pertinent negatives for diabetes include no weight loss. There are no hypoglycemic complications. Symptoms are stable. There are no diabetic complications. Risk factors for coronary artery disease include diabetes mellitus, dyslipidemia, male sex, obesity and hypertension. Current diabetic treatment includes oral agent (dual therapy) and insulin  injections. He is compliant with treatment most of the time. His weight is fluctuating minimally. He is following a generally unhealthy diet. When asked about meal planning, he reported none. He has not had a previous visit with a dietitian. He participates in exercise intermittently. His home blood glucose trend is increasing steadily. His breakfast blood glucose range is generally 180-200 mg/dl. His bedtime blood glucose range is generally >200 mg/dl. (He presents today with his meter and logs above target glycemic profile overall.  His POCT A1c today  is 9.2%, increasing from last visit of 8.5%.  He has been dealing with bilateral knee pain, had steroid injections in both several times since last visit.  He notes he is needing knee replacements in the future.  ) An ACE inhibitor/angiotensin II receptor blocker is being taken. He does not see a podiatrist.Eye exam is current.    Review of systems  Constitutional: + Minimally fluctuating body weight,  current Body mass index is 32.83 kg/m. , no fatigue, no subjective hyperthermia, no subjective hypothermia Eyes: no blurry vision, no xerophthalmia ENT: no sore throat, no nodules palpated in throat, no dysphagia/odynophagia, no hoarseness Cardiovascular: no chest pain, no shortness of breath, no palpitations, no leg swelling Respiratory: no cough, no shortness of breath Gastrointestinal: no nausea/vomiting/diarrhea Musculoskeletal: chronic bilateral knee pain- needing TKR soon Skin: no rashes, no hyperemia Neurological: no tremors, no numbness, no tingling, no dizziness Psychiatric: no depression, no anxiety  Objective:     BP (!) 142/90 (BP Location: Right Arm, Patient Position: Sitting, Cuff Size: Large) Comment: Retake BP with manuel cuff, patient has taken his BP medication this mornning. Both knees have blown out per patient, to see specialist,He is in great pain.  Pulse 84   Ht 5' 7 (1.702 m)   Wt 209 lb 9.6 oz (95.1 kg)   BMI 32.83 kg/m   Wt Readings from Last 3 Encounters:  09/01/23 209 lb 9.6 oz (95.1 kg)  04/28/23 211 lb 9.6 oz (96 kg)  01/18/23 203 lb 3.2 oz (92.2 kg)     BP Readings from Last 3 Encounters:  09/01/23 (!) 142/90  04/28/23 130/82  01/18/23 131/76      Physical Exam- Limited  Constitutional:  Body mass index is 32.83 kg/m. , not in acute distress, normal state of mind Eyes:  EOMI, no exophthalmos Musculoskeletal: no gross deformities, strength intact in all four extremities, no gross restriction of joint movements Skin:  no rashes, no  hyperemia Neurological: no tremor with outstretched hands   Diabetic Foot Exam - Simple   No data filed     CMP ( most recent) CMP     Component Value Date/Time   NA 138 01/13/2023 0846   K 4.9 01/13/2023 0846   CL 99 01/13/2023 0846   CO2 26 01/13/2023 0846   GLUCOSE 135 (H) 01/13/2023 0846   GLUCOSE 196 (H) 07/29/2019 0945   BUN 12 01/13/2023 0846   CREATININE 0.87 01/13/2023 0846   CALCIUM 9.0 01/13/2023 0846   PROT 7.0 01/13/2023 0846   ALBUMIN 4.3 01/13/2023 0846   AST 14 01/13/2023 0846   ALT 17  01/13/2023 0846   ALKPHOS 67 01/13/2023 0846   BILITOT 0.7 01/13/2023 0846   GFRNONAA 103 04/13/2020 0000   GFRAA >60 07/29/2019 0945     Diabetic Labs (most recent): Lab Results  Component Value Date   HGBA1C 8.5 (A) 04/28/2023   HGBA1C 9.8 (A) 01/18/2023   HGBA1C 7.8 (A) 07/01/2022     Lipid Panel ( most recent) Lipid Panel     Component Value Date/Time   CHOL 122 01/13/2023 0846   TRIG 54 01/13/2023 0846   HDL 57 01/13/2023 0846   CHOLHDL 2.1 01/13/2023 0846   LDLCALC 53 01/13/2023 0846   LABVLDL 12 01/13/2023 0846      Lab Results  Component Value Date   TSH 0.709 01/13/2023   TSH 1.280 01/27/2021   FREET4 1.00 01/13/2023   FREET4 0.93 01/27/2021           Assessment & Plan:   1) Controlled Type 2 Diabetes without complication:  He presents today with his meter and logs above target glycemic profile overall.  His POCT A1c today is 9.2%, increasing from last visit of 8.5%.  He has been dealing with bilateral knee pain, had steroid injections in both several times since last visit.  He notes he is needing knee replacements in the future.  He denies any hypoglycemia.  - Alex Taylor has currently uncontrolled symptomatic type 2 DM since 63 years of age.  -Recent labs reviewed.  - I had a long discussion with him about the progressive nature of diabetes and the pathology behind its complications. -his diabetes is not currently complicated but  he remains at a high risk for more acute and chronic complications which include CAD, CVA, CKD, retinopathy, and neuropathy. These are all discussed in detail with him.  The following Lifestyle Medicine recommendations according to American College of Lifestyle Medicine [facility]) were discussed and offered to patient and he agrees to start the journey:  A. Whole Foods, Plant-based plate comprising of fruits and vegetables, plant-based proteins, whole-grain carbohydrates was discussed in detail with the patient.   A list for source of those nutrients were also provided to the patient.  Patient will use only water  or unsweetened tea for hydration. B.  The need to stay away from risky substances including alcohol, smoking; obtaining 7 to 9 hours of restorative sleep, at least 150 minutes of moderate intensity exercise weekly, the importance of healthy social connections,  and stress reduction techniques were discussed. C.  A full color page of  Calorie density of various food groups per pound showing examples of each food groups was provided to the patient.  - Nutritional counseling repeated at each appointment due to patients tendency to fall back in to old habits.  - The patient admits there is a room for improvement in their diet and drink choices. -  Suggestion is made for the patient to avoid simple carbohydrates from their diet including Cakes, Sweet Desserts / Pastries, Ice Cream, Soda (diet and regular), Sweet Tea, Candies, Chips, Cookies, Sweet Pastries, Store Bought Juices, Alcohol in Excess of 1-2 drinks a day, Artificial Sweeteners, Coffee Creamer, and Sugar-free Products. This will help patient to have stable blood glucose profile and potentially avoid unintended weight gain.   - I encouraged the patient to switch to unprocessed or minimally processed complex starch and increased protein intake (animal or plant source), fruits, and vegetables.   - Patient is advised to stick to a routine  mealtimes to eat 3 meals a day  and avoid unnecessary snacks (to snack only to correct hypoglycemia).  - I have approached him with the following individualized plan to manage his diabetes and patient agrees:   -He is advised to continue Tresiba  35 units SQ nightly, Metformin  1000 mg ER daily and Januvia  100 mg po daily.  Will add prandial insulin  with Humalog  5-11 units TID with meals if glucose is above 90 and he is eating(Specific instructions on how to titrate insulin  dosage based on glucose readings given to patient in writing).  He demonstrated his ability to properly use the SSI chart with me today.  He may not need this long-term but it can certainly help get tighter control so he qualifies for surgery soon.  -he is encouraged to continue monitoring blood glucose 4 times daily, before meals and before bed, and to call the clinic if he has readings less than 70 or greater than 300 for 3 tests in a row.  He could benefit from CGM device given insulin  added to regimen, but he prefers to perform traditional fingersticks.  - Adjustment parameters are given to him for hypo and hyperglycemia in writing.  - He did not tolerate GLP1 due to nausea and vomiting.  - Specific targets for  A1c; LDL, HDL, and Triglycerides were discussed with the patient.  2) Blood Pressure /Hypertension:  his blood pressure is controlled to target.   he is advised to continue his current medications including Losartan  50 mg p.o. daily with breakfast.  3) Lipids/Hyperlipidemia:    Review of his recent lipid panel from 01/13/23 showed controlled LDL at 53.  he is advised to continue Lovastatin  10 mg daily at bedtime.  Side effects and precautions discussed with him.    4)  Weight/Diet:  his Body mass index is 32.83 kg/m.  -  clearly complicating his diabetes care.   he is a candidate for weight loss.  I discussed with him the fact that loss of 5 - 10% of his  current body weight will have the most impact on his diabetes  management.  Exercise, and detailed carbohydrates information provided  -  detailed on discharge instructions.    5) Chronic Care/Health Maintenance: -he is on ACEI/ARB and Statin medications and is encouraged to initiate and continue to follow up with Ophthalmology, Dentist, Podiatrist at least yearly or according to recommendations, and advised to stay away from smoking. I have recommended yearly flu vaccine and pneumonia vaccine at least every 5 years; moderate intensity exercise for up to 150 minutes weekly; and sleep for at least 7 hours a day.  - he is advised to maintain close follow up with Dow Longs, PA-C for primary care needs, as well as his other providers for optimal and coordinated care.     I spent  48  minutes in the care of the patient today including review of labs from CMP, Lipids, Thyroid  Function, Hematology (current and previous including abstractions from other facilities); face-to-face time discussing  his blood glucose readings/logs, discussing hypoglycemia and hyperglycemia episodes and symptoms, medications doses, his options of short and long term treatment based on the latest standards of care / guidelines;  discussion about incorporating lifestyle medicine;  and documenting the encounter. Risk reduction counseling performed per USPSTF guidelines to reduce obesity and cardiovascular risk factors.     Please refer to Patient Instructions for Blood Glucose Monitoring and Insulin /Medications Dosing Guide  in media tab for additional information. Please  also refer to  Patient Self Inventory in the Media  tab for reviewed elements of pertinent patient history.  Alex Taylor participated in the discussions, expressed understanding, and voiced agreement with the above plans.  All questions were answered to his satisfaction. he is encouraged to contact clinic should he have any questions or concerns prior to his return visit.     Follow up plan: - Return in about 3  months (around 12/02/2023) for Diabetes F/U with A1c in office, Bring meter and logs.  Benton Rio, [facility] [address] [place], [place] [address] Phone: [phone] Fax: [phone]  09/01/2023, 8:50 AM

## 2023-09-01 NOTE — Telephone Encounter (Signed)
 Will you call and explain this to pts wife, she's wanting a call today before she goes and picks up medications

## 2023-09-01 NOTE — Telephone Encounter (Signed)
 He is to continue the medications he was taking for diabetes previously (including the Tresiba , Metformin  and Januvia ), I just added meal time insulin  to his regimen.

## 2023-09-01 NOTE — Telephone Encounter (Signed)
 Patient's wife was concerned as the patient had not been taking the Januiva since November. This was discussed with Whitney, she reviewed chart notes, going back over a year ago, and she has not taken the patient off the medication.  Patient's wife was called and made aware. Also reviewed that the patient is to be injecting Tresiba  , added today Humalog  , and he was to continue Metformin  and his Januiva.

## 2023-09-01 NOTE — Telephone Encounter (Signed)
 Pts wife called asking questions about what medications he is supposed to be taking, says one sheet says to discontinue januvia , but the other sheet has it listed and a RX was called in.

## 2023-09-04 LAB — POCT GLYCOSYLATED HEMOGLOBIN (HGB A1C): Hemoglobin A1C: 9.2 % — AB (ref 4.0–5.6)

## 2023-09-04 NOTE — Addendum Note (Signed)
 Addended by: KRYSTAL MADELIN HERO on: 09/04/2023 09:50 AM   Modules accepted: Orders

## 2023-09-23 ENCOUNTER — Other Ambulatory Visit (HOSPITAL_COMMUNITY): Payer: Self-pay

## 2023-09-23 ENCOUNTER — Telehealth: Admitting: Nurse Practitioner

## 2023-09-23 DIAGNOSIS — B9689 Other specified bacterial agents as the cause of diseases classified elsewhere: Secondary | ICD-10-CM

## 2023-09-23 DIAGNOSIS — J329 Chronic sinusitis, unspecified: Secondary | ICD-10-CM | POA: Diagnosis not present

## 2023-09-23 MED ORDER — DOXYCYCLINE HYCLATE 100 MG PO TABS: 100.0000 mg | ORAL_TABLET | Freq: Two times a day (BID) | ORAL | 0 refills | Status: AC

## 2023-09-23 NOTE — Progress Notes (Signed)
 I have spent 5 minutes in review of e-visit questionnaire, review and updating patient chart, medical decision making and response to patient.   Claiborne Rigg, NP

## 2023-09-23 NOTE — Progress Notes (Signed)

## 2023-09-25 ENCOUNTER — Other Ambulatory Visit (HOSPITAL_COMMUNITY): Payer: Self-pay

## 2023-09-26 ENCOUNTER — Other Ambulatory Visit (HOSPITAL_COMMUNITY): Payer: Self-pay

## 2023-09-26 ENCOUNTER — Other Ambulatory Visit: Payer: Self-pay

## 2023-09-29 DIAGNOSIS — R5383 Other fatigue: Secondary | ICD-10-CM | POA: Diagnosis not present

## 2023-09-29 DIAGNOSIS — Z0000 Encounter for general adult medical examination without abnormal findings: Secondary | ICD-10-CM | POA: Diagnosis not present

## 2023-09-29 DIAGNOSIS — E782 Mixed hyperlipidemia: Secondary | ICD-10-CM | POA: Diagnosis not present

## 2023-10-03 ENCOUNTER — Encounter (INDEPENDENT_AMBULATORY_CARE_PROVIDER_SITE_OTHER): Payer: Self-pay | Admitting: *Deleted

## 2023-10-10 ENCOUNTER — Other Ambulatory Visit (HOSPITAL_COMMUNITY): Payer: Self-pay

## 2023-11-21 ENCOUNTER — Telehealth: Payer: Self-pay | Admitting: *Deleted

## 2023-11-21 NOTE — Telephone Encounter (Signed)
 Meade called the office on 11/20/23,she states that Alex Taylor was in severe pain with his knee, and that he was needing knee surgery. He is needing to have knee surgery. She is asking if we would do this or does PCP need to. I do see that that the patient has an appointment October 17 with [facility].

## 2023-11-22 NOTE — Telephone Encounter (Signed)
 Noted

## 2023-11-22 NOTE — Telephone Encounter (Signed)
 I talked with her and shared that the surgeon most times would send out a form for the specialist, PCP, ect to complete and then it is faxed back to them. I recommended that she reach out to the Orthopedic Surgeon.

## 2023-11-22 NOTE — Telephone Encounter (Signed)
 Yes, that is typically what happens from my experience.  The ortho surgeon will send surgery clearance forms to all that is involved.

## 2023-11-22 NOTE — Telephone Encounter (Signed)
 She is asking if we would do what exactly?  Put in a referral to ortho?  The PCP would typically take care of that.

## 2023-12-08 ENCOUNTER — Encounter: Payer: Self-pay | Admitting: Nurse Practitioner

## 2023-12-08 ENCOUNTER — Other Ambulatory Visit (HOSPITAL_COMMUNITY): Payer: Self-pay

## 2023-12-08 ENCOUNTER — Other Ambulatory Visit: Payer: Self-pay

## 2023-12-08 ENCOUNTER — Ambulatory Visit: Admitting: Nurse Practitioner

## 2023-12-08 VITALS — BP 124/80 | HR 85 | Ht 67.0 in | Wt 213.6 lb

## 2023-12-08 DIAGNOSIS — Z7984 Long term (current) use of oral hypoglycemic drugs: Secondary | ICD-10-CM

## 2023-12-08 DIAGNOSIS — E119 Type 2 diabetes mellitus without complications: Secondary | ICD-10-CM | POA: Diagnosis not present

## 2023-12-08 DIAGNOSIS — Z794 Long term (current) use of insulin: Secondary | ICD-10-CM

## 2023-12-08 DIAGNOSIS — E782 Mixed hyperlipidemia: Secondary | ICD-10-CM

## 2023-12-08 LAB — POCT GLYCOSYLATED HEMOGLOBIN (HGB A1C): Hemoglobin A1C: 7.8 % — AB (ref 4.0–5.6)

## 2023-12-08 MED ORDER — TRESIBA FLEXTOUCH 100 UNIT/ML ~~LOC~~ SOPN
40.0000 [IU] | PEN_INJECTOR | Freq: Every day | SUBCUTANEOUS | 3 refills | Status: AC
  Filled 2023-12-08: qty 36, 90d supply, fill #0

## 2023-12-08 NOTE — Progress Notes (Signed)
 Endocrinology Follow Up Note       12/08/2023, 9:11 AM   Subjective:    Patient ID: Alex Taylor, male    DOB: [birth year].  Alex Taylor is being seen in follow up after being seen in consultation for management of currently uncontrolled symptomatic diabetes requested by  Dow Longs, PA-C.   Past Medical History:  Diagnosis Date   Diabetes mellitus without complication [facility])     Past Surgical History:  Procedure Laterality Date   BACK SURGERY     COLONOSCOPY  12/20/2011   Procedure: COLONOSCOPY;  Surgeon: Oneil DELENA Budge, MD;  Location: [facility];  Service: Gastroenterology;  Laterality: N/A;   KNEE ARTHROSCOPY     left knee    Social History   Socioeconomic History   Marital status: Married    Spouse name: Not on file   Number of children: Not on file   Years of education: Not on file   Highest education level: Not on file  Occupational History   Not on file  Tobacco Use   Smoking status: Former   Smokeless tobacco: Never  Vaping Use   Vaping status: Never Used  Substance and Sexual Activity   Alcohol use: Yes    Comment: occ   Drug use: No   Sexual activity: Not on file  Other Topics Concern   Not on file  Social History Narrative   Not on file   Social Drivers of Health   Financial Resource Strain: Not on file  Food Insecurity: Not on file  Transportation Needs: Not on file  Physical Activity: Not on file  Stress: Not on file  Social Connections: Not on file    Family History  Problem Relation Age of Onset   Cancer Mother    Osteoporosis Mother    Hypertension Father    Hyperlipidemia Father     Current Outpatient Medications on File Prior to Visit  Medication Sig Dispense Refill   glucose blood (ACCU-CHEK GUIDE) test strip Use as instructed to monitor glucose 2 times daily 100 each 12   HYDROcodone -acetaminophen  (NORCO) 10-325 MG tablet Take 1 tablet by mouth 4  (four) times daily as needed. 120 tablet 0   ibuprofen  (ADVIL ,MOTRIN ) 200 MG tablet Take 200 mg by mouth every 6 (six) hours as needed. For pain     insulin  lispro (HUMALOG  KWIKPEN) 200 UNIT/ML KwikPen Inject 5-11 Units into the skin 3 (three) times daily before meals. 30 mL 3   Insulin  Pen Needle (PEN NEEDLES) 31G X 6 MM MISC Use to inject insulin  4 times daily 300 each 3   losartan  (COZAAR ) 50 MG tablet Take 1 tablet (50 mg total) by mouth daily. 90 tablet 3   lovastatin  (MEVACOR ) 10 MG tablet Take 1 tablet (10 mg total) by mouth every evening. 90 tablet 3   metFORMIN  (GLUCOPHAGE -XR) 500 MG 24 hr tablet Take 2 tablets (1,000 mg total) by mouth daily with breakfast. 180 tablet 3   No current facility-administered medications on file prior to visit.      ALLERGIES: Allergies  Allergen Reactions   Codeine Other (See Comments)    makes me go stupid per patient  Penicillins Other (See Comments)    Childhood reaction    VACCINATION STATUS:  There is no immunization history on file for this patient.  Diabetes He presents for his follow-up diabetic visit. He has type 2 diabetes mellitus. Onset time: diagnosed at approx age of 55. His disease course has been improving. There are no hypoglycemic associated symptoms. Pertinent negatives for diabetes include no weight loss. There are no hypoglycemic complications. Symptoms are stable. There are no diabetic complications. Risk factors for coronary artery disease include diabetes mellitus, dyslipidemia, male sex, obesity and hypertension. Current diabetic treatment includes oral agent (monotherapy) and intensive insulin  program. He is compliant with treatment most of the time. His weight is fluctuating minimally. He is following a generally healthy diet. When asked about meal planning, he reported none. He has not had a previous visit with a dietitian. He participates in exercise intermittently. His home blood glucose trend is decreasing steadily.  His breakfast blood glucose range is generally 130-140 mg/dl. His lunch blood glucose range is generally 140-180 mg/dl. His dinner blood glucose range is generally 140-180 mg/dl. His overall blood glucose range is 140-180 mg/dl. (He presents today, accompanied by his wife, with his logs showing greatly improved glycemic profile overall.  His POCT A1c today is 7.8%, improving from last visit of 9.2%.  He is now at a level I believe safe enough to have his total knee replacement.  He stopped taking the Januvia  a long time ago (unknown to me) but cannot recall why.  He denies any hypoglycemia.) An ACE inhibitor/angiotensin II receptor blocker is being taken. He does not see a podiatrist.Eye exam is current.    Review of systems  Constitutional: + Minimally fluctuating body weight,  current Body mass index is 33.45 kg/m. , no fatigue, no subjective hyperthermia, no subjective hypothermia Eyes: no blurry vision, no xerophthalmia ENT: no sore throat, no nodules palpated in throat, no dysphagia/odynophagia, no hoarseness Cardiovascular: no chest pain, no shortness of breath, no palpitations, no leg swelling Respiratory: no cough, no shortness of breath Gastrointestinal: no nausea/vomiting/diarrhea Musculoskeletal: chronic bilateral knee pain- needing R TKR soon Skin: no rashes, no hyperemia Neurological: no tremors, no numbness, no tingling, no dizziness Psychiatric: no depression, no anxiety  Objective:     BP 124/80 (BP Location: Left Arm, Patient Position: Sitting, Cuff Size: Large)   Pulse 85   Ht 5' 7 (1.702 m)   Wt 213 lb 9.6 oz (96.9 kg)   BMI 33.45 kg/m   Wt Readings from Last 3 Encounters:  12/08/23 213 lb 9.6 oz (96.9 kg)  09/01/23 209 lb 9.6 oz (95.1 kg)  04/28/23 211 lb 9.6 oz (96 kg)     BP Readings from Last 3 Encounters:  12/08/23 124/80  09/01/23 (!) 142/90  04/28/23 130/82       Physical Exam- Limited  Constitutional:  Body mass index is 33.45 kg/m. , not in  acute distress, normal state of mind Eyes:  EOMI, no exophthalmos Musculoskeletal: no gross deformities, strength intact in all four extremities, no gross restriction of joint movements Skin:  no rashes, no hyperemia Neurological: no tremor with outstretched hands   Diabetic Foot Exam - Simple   No data filed     CMP ( most recent) CMP     Component Value Date/Time   NA 138 01/13/2023 0846   K 4.9 01/13/2023 0846   CL 99 01/13/2023 0846   CO2 26 01/13/2023 0846   GLUCOSE 135 (H) 01/13/2023 0846   GLUCOSE 196 (  H) 07/29/2019 0945   BUN 12 01/13/2023 0846   CREATININE 0.87 01/13/2023 0846   CALCIUM 9.0 01/13/2023 0846   PROT 7.0 01/13/2023 0846   ALBUMIN 4.3 01/13/2023 0846   AST 14 01/13/2023 0846   ALT 17 01/13/2023 0846   ALKPHOS 67 01/13/2023 0846   BILITOT 0.7 01/13/2023 0846   GFRNONAA 103 04/13/2020 0000   GFRAA >60 07/29/2019 0945     Diabetic Labs (most recent): Lab Results  Component Value Date   HGBA1C 7.8 (A) 12/08/2023   HGBA1C 9.2 (A) 09/04/2023   HGBA1C 8.5 (A) 04/28/2023     Lipid Panel ( most recent) Lipid Panel     Component Value Date/Time   CHOL 122 01/13/2023 0846   TRIG 54 01/13/2023 0846   HDL 57 01/13/2023 0846   CHOLHDL 2.1 01/13/2023 0846   LDLCALC 53 01/13/2023 0846   LABVLDL 12 01/13/2023 0846      Lab Results  Component Value Date   TSH 0.709 01/13/2023   TSH 1.280 01/27/2021   FREET4 1.00 01/13/2023   FREET4 0.93 01/27/2021           Assessment & Plan:   1) Controlled Type 2 Diabetes without complication:  He presents today, accompanied by his wife, with his logs showing greatly improved glycemic profile overall.  His POCT A1c today is 7.8%, improving from last visit of 9.2%.  He is now at a level I believe safe enough to have his total knee replacement.  He stopped taking the Januvia  a long time ago (unknown to me) but cannot recall why.  He denies any hypoglycemia.  - FOUAD TAUL has currently uncontrolled  symptomatic type 2 DM since 63 years of age.  -Recent labs reviewed.  - I had a long discussion with him about the progressive nature of diabetes and the pathology behind its complications. -his diabetes is not currently complicated but he remains at a high risk for more acute and chronic complications which include CAD, CVA, CKD, retinopathy, and neuropathy. These are all discussed in detail with him.  The following Lifestyle Medicine recommendations according to American College of Lifestyle Medicine [facility]) were discussed and offered to patient and he agrees to start the journey:  A. Whole Foods, Plant-based plate comprising of fruits and vegetables, plant-based proteins, whole-grain carbohydrates was discussed in detail with the patient.   A list for source of those nutrients were also provided to the patient.  Patient will use only water  or unsweetened tea for hydration. B.  The need to stay away from risky substances including alcohol, smoking; obtaining 7 to 9 hours of restorative sleep, at least 150 minutes of moderate intensity exercise weekly, the importance of healthy social connections,  and stress reduction techniques were discussed. C.  A full color page of  Calorie density of various food groups per pound showing examples of each food groups was provided to the patient.  - Nutritional counseling repeated/built upon at each appointment.  - The patient admits there is a room for improvement in their diet and drink choices. -  Suggestion is made for the patient to avoid simple carbohydrates from their diet including Cakes, Sweet Desserts / Pastries, Ice Cream, Soda (diet and regular), Sweet Tea, Candies, Chips, Cookies, Sweet Pastries, Store Bought Juices, Alcohol in Excess of 1-2 drinks a day, Artificial Sweeteners, Coffee Creamer, and Sugar-free Products. This will help patient to have stable blood glucose profile and potentially avoid unintended weight gain.   - I encouraged the  patient to switch to unprocessed or minimally processed complex starch and increased protein intake (animal or plant source), fruits, and vegetables.   - Patient is advised to stick to a routine mealtimes to eat 3 meals a day and avoid unnecessary snacks (to snack only to correct hypoglycemia).  - I have approached him with the following individualized plan to manage his diabetes and patient agrees:   -He is advised to increase Tresiba  to 40 units SQ nightly (to help get fasting sugars between 90-130) and continue Humalog  5-11 units TID with meals if glucose is above 90 and he is eating (Specific instructions on how to titrate insulin  dosage based on glucose readings given to patient in writing).  He can also continue Metformin  1000 mg ER daily.  -he is encouraged to continue monitoring blood glucose 4 times daily, before meals and before bed, and to call the clinic if he has readings less than 70 or greater than 300 for 3 tests in a row.  He could benefit from CGM device given insulin  added to regimen, but he prefers to perform traditional fingersticks.  - Adjustment parameters are given to him for hypo and hyperglycemia in writing.  - He did not tolerate GLP1 due to nausea and vomiting.  - Specific targets for  A1c; LDL, HDL, and Triglycerides were discussed with the patient.  2) Blood Pressure /Hypertension:  his blood pressure is controlled to target.   he is advised to continue his current medications including Losartan  50 mg p.o. daily with breakfast.  3) Lipids/Hyperlipidemia:    Review of his recent lipid panel from 01/13/23 showed controlled LDL at 53.  he is advised to continue Lovastatin  10 mg daily at bedtime.  Side effects and precautions discussed with him.    4)  Weight/Diet:  his Body mass index is 33.45 kg/m.  -  clearly complicating his diabetes care.   he is a candidate for weight loss.  I discussed with him the fact that loss of 5 - 10% of his  current body weight will  have the most impact on his diabetes management.  Exercise, and detailed carbohydrates information provided  -  detailed on discharge instructions.    5) Chronic Care/Health Maintenance: -he is on ACEI/ARB and Statin medications and is encouraged to initiate and continue to follow up with Ophthalmology, Dentist, Podiatrist at least yearly or according to recommendations, and advised to stay away from smoking. I have recommended yearly flu vaccine and pneumonia vaccine at least every 5 years; moderate intensity exercise for up to 150 minutes weekly; and sleep for at least 7 hours a day.  - he is advised to maintain close follow up with Dow Longs, PA-C for primary care needs, as well as his other providers for optimal and coordinated care.   From a diabetes standpoint, he is cleared for surgery.  I did advise him and his wife to call the orthopedic office to discuss next steps.   I spent  46  minutes in the care of the patient today including review of labs from CMP, Lipids, Thyroid  Function, Hematology (current and previous including abstractions from other facilities); face-to-face time discussing  his blood glucose readings/logs, discussing hypoglycemia and hyperglycemia episodes and symptoms, medications doses, his options of short and long term treatment based on the latest standards of care / guidelines;  discussion about incorporating lifestyle medicine;  and documenting the encounter. Risk reduction counseling performed per USPSTF guidelines to reduce obesity and cardiovascular risk factors.  Please refer to Patient Instructions for Blood Glucose Monitoring and Insulin /Medications Dosing Guide  in media tab for additional information. Please  also refer to  Patient Self Inventory in the Media  tab for reviewed elements of pertinent patient history.  Alex Taylor participated in the discussions, expressed understanding, and voiced agreement with the above plans.  All questions were  answered to his satisfaction. he is encouraged to contact clinic should he have any questions or concerns prior to his return visit.    Follow up plan: - Return in about 4 months (around 04/09/2024) for Diabetes F/U with A1c in office, No previsit labs, Bring meter and logs.  Benton Rio, [facility] [address] [place], [place] [address] Phone: [phone] Fax: [phone]  12/08/2023, 9:11 AM

## 2023-12-25 ENCOUNTER — Encounter: Payer: Self-pay | Admitting: Radiology

## 2024-01-01 DIAGNOSIS — M1711 Unilateral primary osteoarthritis, right knee: Secondary | ICD-10-CM | POA: Diagnosis not present

## 2024-01-04 ENCOUNTER — Other Ambulatory Visit (HOSPITAL_COMMUNITY): Payer: Self-pay

## 2024-01-05 ENCOUNTER — Other Ambulatory Visit (HOSPITAL_COMMUNITY): Payer: Self-pay

## 2024-01-05 DIAGNOSIS — M1711 Unilateral primary osteoarthritis, right knee: Secondary | ICD-10-CM | POA: Diagnosis not present

## 2024-01-05 DIAGNOSIS — M6281 Muscle weakness (generalized): Secondary | ICD-10-CM | POA: Diagnosis not present

## 2024-01-05 DIAGNOSIS — M25661 Stiffness of right knee, not elsewhere classified: Secondary | ICD-10-CM | POA: Diagnosis not present

## 2024-01-08 NOTE — Progress Notes (Signed)
 COVID Vaccine received:  []  No [x]  Yes Date of any COVID positive Test in last 90 days: no PCP - Rocky Don PA-C Cardiologist - n/a  Chest x-ray -  EKG -   Stress Test -  ECHO -  Cardiac Cath -   Bowel Prep - [x]  No  []   Yes ______  Pacemaker / ICD device [x]  No []  Yes   Spinal Cord Stimulator:[x]  No []  Yes       History of Sleep Apnea? [x]  No []  Yes   CPAP used?- [x]  No []  Yes    Does the patient monitor blood sugar?          []  No [x]  Yes  []  N/A  Patient has: []  NO Hx DM   []  Pre-DM                 []  DM1  [x]   DM2 Does patient have a [employer] or Dexacom?  No []  Yes   Fasting Blood Sugar Ranges- 90-150 Checks Blood Sugar ___3__ times a day  GLP1 agonist / usual dose - no GLP1 instructions:  SGLT-2 inhibitors / usual dose - no SGLT-2 instructions:   Blood Thinner / Instructions:no Aspirin Instructions:ASA 81 mg last dose 01/09/24  Comments:   Activity level: Patient is able  to climb a flight of stairs without difficulty; [x]  No CP  [x]  No SOB,   Patient can perform ADLs without assistance.   Anesthesia review:   Patient denies shortness of breath, fever, cough and chest pain at PAT appointment.  Patient verbalized understanding and agreement to the Pre-Surgical Instructions that were given to them at this PAT appointment. Patient was also educated of the need to review these PAT instructions again prior to his/her surgery.I reviewed the appropriate phone numbers to call if they have any and questions or concerns.

## 2024-01-08 NOTE — Patient Instructions (Signed)
 SURGICAL WAITING ROOM VISITATION  Patients having surgery or a procedure may have no more than 2 support people in the waiting area - these visitors may rotate.    Children under the age of 26 must have an adult with them who is not the patient.  Visitors with respiratory illnesses are discouraged from visiting and should remain at home.  If the patient needs to stay at the hospital during part of their recovery, the visitor guidelines for inpatient rooms apply. Pre-op nurse will coordinate an appropriate time for 1 support person to accompany patient in pre-op.  This support person may not rotate.    Please refer to the [facility] website for the visitor guidelines for Inpatients (after your surgery is over and you are in a regular room).       Your procedure is scheduled on: 01/16/24   Report to [facility] Main Entrance    Report to admitting at 7:45 AM   Call this number if you have problems the morning of surgery [phone]   Do not eat food :After Midnight.   After Midnight you may have the following liquids until 7:15 AM  DAY OF SURGERY  Water  Non-Citrus Juices (without pulp, NO RED-Apple, White grape, White cranberry) Black Coffee (NO MILK/CREAM OR CREAMERS, sugar ok)  Clear Tea (NO MILK/CREAM OR CREAMERS, sugar ok) regular and decaf                             Plain Jell-O (NO RED)                                           Fruit ices (not with fruit pulp, NO RED)                                     Popsicles (NO RED)                                                               Sports drinks like Gatorade (NO RED)                   The day of surgery:  Drink ONE (1) Pre-Surgery  G2 at 7:15 AM the morning of surgery. Drink in one sitting. Do not sip.  This drink was given to you during your hospital  pre-op appointment visit. Nothing else to drink after completing the  Pre-Surgery G2.    Oral Hygiene is also important to reduce your risk of infection.                                     Remember - BRUSH YOUR TEETH THE MORNING OF SURGERY WITH YOUR REGULAR TOOTHPASTE  DENTURES WILL BE REMOVED PRIOR TO SURGERY PLEASE DO NOT APPLY Poly grip OR ADHESIVES!!!     Stop all vitamins and herbal supplements 7 days before surgery.   Take these medicines the morning of surgery with A SIP OF WATER : Hydrocodone  if  needed, Lovastatin .               Do not take losartan  the morning of surgery.   DO NOT TAKE ANY ORAL DIABETIC MEDICATIONS DAY OF YOUR SURGERY             You may not have any metal on your body including hair pins, jewelry, and body piercing             Do not wear make-up, lotions, powders, perfumes/cologne, or deodorant               Men may shave face and neck.   Do not bring valuables to the hospital. [facility] IS NOT             RESPONSIBLE   FOR VALUABLES.   Contacts, glasses, dentures or bridgework may not be worn into surgery.    DO NOT BRING YOUR HOME MEDICATIONS TO THE HOSPITAL. PHARMACY WILL DISPENSE MEDICATIONS LISTED ON YOUR MEDICATION LIST TO YOU DURING YOUR ADMISSION IN THE HOSPITAL!    Patients discharged on the day of surgery will not be allowed to drive home.  Someone NEEDS to stay with you for the first 24 hours after anesthesia.   Special Instructions: Bring a copy of your healthcare power of attorney and living will documents the day of surgery if you haven't scanned them before.              Please read over the following fact sheets you were given: IF YOU HAVE QUESTIONS ABOUT YOUR PRE-OP INSTRUCTIONS PLEASE CALL [phone] [place]   If you received a COVID test during your pre-op visit  it is requested that you wear a mask when out in public, stay away from anyone that may not be feeling well and notify your surgeon if you develop symptoms. If you test positive for Covid or have been in contact with anyone that has tested positive in the last 10 days please notify you surgeon.      Pre-operative 4  CHG Bath Instructions  DYNA-Hex 4 Chlorhexidine Gluconate 4% Solution Antiseptic 4 fl. oz   You can play a key role in reducing the risk of infection after surgery. Your skin needs to be as free of germs as possible. You can reduce the number of germs on your skin by washing with CHG (chlorhexidine gluconate) soap before surgery. CHG is an antiseptic soap that kills germs and continues to kill germs even after washing.   DO NOT use if you have an allergy to chlorhexidine/CHG or antibacterial soaps. If your skin becomes reddened or irritated, stop using the CHG and notify one of our RNs at   Please shower with the CHG soap starting 4 days before surgery using the following schedule:     Please keep in mind the following:  DO NOT shave, including legs and underarms, starting the day of your first shower.   You may shave your face at any point before/day of surgery.  Place clean sheets on your bed the day you start using CHG soap. Use a clean washcloth (not used since being washed) for each shower. DO NOT sleep with pets once you start using the CHG.  CHG Shower Instructions:  If you choose to wash your hair and private area, wash first with your normal shampoo/soap.  After you use shampoo/soap, rinse your hair and body thoroughly to remove shampoo/soap residue.  Turn the water  OFF and apply about 3 tablespoons (45 ml) of CHG soap  to a [employer].  Apply CHG soap ONLY FROM YOUR NECK DOWN TO YOUR TOES (washing for 3-5 minutes)  DO NOT use CHG soap on face, private areas, open wounds, or sores.  Pay special attention to the area where your surgery is being performed.  If you are having back surgery, having someone wash your back for you may be helpful. Wait 2 minutes after CHG soap is applied, then you may rinse off the CHG soap.  Pat dry with a clean towel  Put on clean clothes/pajamas   If you choose to wear lotion, please use ONLY the CHG-compatible lotions on the back of this paper.      Additional instructions for the day of surgery: DO NOT APPLY any lotions, deodorants, cologne, or perfumes.   Put on clean/comfortable clothes.  Brush your teeth.  Ask your nurse before applying any prescription medications to the skin.   CHG Compatible Lotions   Aveeno Moisturizing lotion  Cetaphil Moisturizing Cream  Cetaphil Moisturizing Lotion  Clairol Herbal Essence Moisturizing Lotion, Dry Skin  Clairol Herbal Essence Moisturizing Lotion, Extra Dry Skin  Clairol Herbal Essence Moisturizing Lotion, Normal Skin  Curel Age Defying Therapeutic Moisturizing Lotion with Alpha Hydroxy  Curel Extreme Care Body Lotion  Curel Soothing Hands Moisturizing Hand Lotion  Curel Therapeutic Moisturizing Cream, Fragrance-Free  Curel Therapeutic Moisturizing Lotion, Fragrance-Free  Curel Therapeutic Moisturizing Lotion, Original Formula  Eucerin Daily Replenishing Lotion  Eucerin Dry Skin Therapy Plus Alpha Hydroxy Crme  Eucerin Dry Skin Therapy Plus Alpha Hydroxy Lotion  Eucerin Original Crme  Eucerin Original Lotion  Eucerin Plus Crme Eucerin Plus Lotion  Eucerin TriLipid Replenishing Lotion  Keri Anti-Bacterial Hand Lotion  Keri Deep Conditioning Original Lotion Dry Skin Formula Softly Scented  Keri Deep Conditioning Original Lotion, Fragrance Free Sensitive Skin Formula  Keri Lotion Fast Absorbing Fragrance Free Sensitive Skin Formula  Keri Lotion Fast Absorbing Softly Scented Dry Skin Formula  Keri Original Lotion  Keri Skin Renewal Lotion Keri Silky Smooth Lotion  Keri Silky Smooth Sensitive Skin Lotion  Nivea Body Creamy Conditioning Oil  Nivea Body Extra Enriched Lotion  Nivea Body Original Lotion  Nivea Body Sheer Moisturizing Lotion Nivea Crme  Nivea Skin Firming Lotion  NutraDerm 30 Skin Lotion  NutraDerm Skin Lotion  NutraDerm Therapeutic Skin Cream  NutraDerm Therapeutic Skin Lotion  ProShield Protective Hand Cream  Incentive Spirometer  An incentive  spirometer is a tool that can help keep your lungs clear and active. This tool measures how well you are filling your lungs with each breath. Taking long deep breaths may help reverse or decrease the chance of developing breathing (pulmonary) problems (especially infection) following: A long period of time when you are unable to move or be active. BEFORE THE PROCEDURE  If the spirometer includes an indicator to show your best effort, your nurse or respiratory therapist will set it to a desired goal. If possible, sit up straight or lean slightly forward. Try not to slouch. Hold the incentive spirometer in an upright position. INSTRUCTIONS FOR USE  Sit on the edge of your bed if possible, or sit up as far as you can in bed or on a chair. Hold the incentive spirometer in an upright position. Breathe out normally. Place the mouthpiece in your mouth and seal your lips tightly around it. Breathe in slowly and as deeply as possible, raising the piston or the ball toward the top of the column. Hold your breath for 3-5 seconds or for as long as  possible. Allow the piston or ball to fall to the bottom of the column. Remove the mouthpiece from your mouth and breathe out normally. Rest for a few seconds and repeat Steps 1 through 7 at least 10 times every 1-2 hours when you are awake. Take your time and take a few normal breaths between deep breaths. The spirometer may include an indicator to show your best effort. Use the indicator as a goal to work toward during each repetition. After each set of 10 deep breaths, practice coughing to be sure your lungs are clear. If you have an incision (the cut made at the time of surgery), support your incision when coughing by placing a pillow or rolled up towels firmly against it. Once you are able to get out of bed, walk around indoors and cough well. You may stop using the incentive spirometer when instructed by your caregiver.  RISKS AND COMPLICATIONS Take your time  so you do not get dizzy or light-headed. If you are in pain, you may need to take or ask for pain medication before doing incentive spirometry. It is harder to take a deep breath if you are having pain. AFTER USE Rest and breathe slowly and easily. It can be helpful to keep track of a log of your progress. Your caregiver can provide you with a simple table to help with this. If you are using the spirometer at home, follow these instructions: SEEK MEDICAL CARE IF:  You are having difficultly using the spirometer. You have trouble using the spirometer as often as instructed. Your pain medication is not giving enough relief while using the spirometer. You develop fever of 100.5 F (38.1 C) or higher. SEEK IMMEDIATE MEDICAL CARE IF:  You cough up bloody sputum that had not been present before. You develop fever of 102 F (38.9 C) or greater. You develop worsening pain at or near the incision site. MAKE SURE YOU:  Understand these instructions. Will watch your condition. Will get help right away if you are not doing well or get worse.   SABRAHow to Manage Your Diabetes Before and After Surgery  Why is it important to control my blood sugar before and after surgery? Improving blood sugar levels before and after surgery helps healing and can limit problems. A way of improving blood sugar control is eating a healthy diet by:  Eating less sugar and carbohydrates  Increasing activity/exercise  Talking with your doctor about reaching your blood sugar goals High blood sugars (greater than 180 mg/dL) can raise your risk of infections and slow your recovery, so you will need to focus on controlling your diabetes during the weeks before surgery. Make sure that the doctor who takes care of your diabetes knows about your planned surgery including the date and location.  How do I manage my blood sugar before surgery? Check your blood sugar at least 4 times a day, starting 2 days before surgery, to make  sure that the level is not too high or low. Check your blood sugar the morning of your surgery when you wake up and every 2 hours until you get to the Short Stay unit. If your blood sugar is less than 70 mg/dL, you will need to treat for low blood sugar: Do not take insulin . Treat a low blood sugar (less than 70 mg/dL) with  cup of clear juice (cranberry or apple), 4 glucose tablets, OR glucose gel. Recheck blood sugar in 15 minutes after treatment (to make sure it is greater  than 70 mg/dL). If your blood sugar is not greater than 70 mg/dL on recheck, call [phone] for further instructions. Report your blood sugar to the short stay nurse when you get to Short Stay.  If you are admitted to the hospital after surgery: Your blood sugar will be checked by the staff and you will probably be given insulin  after surgery (instead of oral diabetes medicines) to make sure you have good blood sugar levels. The goal for blood sugar control after surgery is 80-180 mg/dL.   WHAT DO I DO ABOUT MY DIABETES MEDICATION?  Do not take oral diabetes medicines (pills) the morning of surgery.  THE NIGHT BEFORE SURGERY, take only half of normal dose of insulin .       THE MORNING OF SURGERY,if blood sugar greater than 220 take half of insulin . If not greater than 220 don;t take any.  Patient Signature:  Date:   Nurse Signature:  Date:   Reviewed and Endorsed by [facility] Patient Education Committee, August 2015

## 2024-01-09 ENCOUNTER — Encounter (HOSPITAL_COMMUNITY): Payer: Self-pay

## 2024-01-09 ENCOUNTER — Other Ambulatory Visit: Payer: Self-pay

## 2024-01-09 ENCOUNTER — Encounter (HOSPITAL_COMMUNITY)
Admission: RE | Admit: 2024-01-09 | Discharge: 2024-01-09 | Disposition: A | Discharge status: Home / Self Care | Source: Ambulatory Visit | Attending: Orthopedic Surgery | Admitting: Orthopedic Surgery

## 2024-01-09 VITALS — BP 159/86 | HR 99 | Temp 98.2°F | Resp 18 | Ht 67.0 in

## 2024-01-09 DIAGNOSIS — E119 Type 2 diabetes mellitus without complications: Secondary | ICD-10-CM | POA: Insufficient documentation

## 2024-01-09 DIAGNOSIS — Z01818 Encounter for other preprocedural examination: Secondary | ICD-10-CM | POA: Insufficient documentation

## 2024-01-09 DIAGNOSIS — Z01812 Encounter for preprocedural laboratory examination: Secondary | ICD-10-CM | POA: Diagnosis present

## 2024-01-09 DIAGNOSIS — Z794 Long term (current) use of insulin: Secondary | ICD-10-CM | POA: Diagnosis not present

## 2024-01-09 DIAGNOSIS — I10 Essential (primary) hypertension: Secondary | ICD-10-CM | POA: Diagnosis not present

## 2024-01-09 DIAGNOSIS — Z01810 Encounter for preprocedural cardiovascular examination: Secondary | ICD-10-CM | POA: Diagnosis present

## 2024-01-09 HISTORY — DX: Personal history of urinary calculi: Z87.442

## 2024-01-09 HISTORY — DX: Unspecified osteoarthritis, unspecified site: M19.90

## 2024-01-09 HISTORY — DX: Essential (primary) hypertension: I10

## 2024-01-09 LAB — CBC
HCT: 44.6 % (ref 39.0–52.0)
Hemoglobin: 15.1 g/dL (ref 13.0–17.0)
MCH: 30.1 pg (ref 26.0–34.0)
MCHC: 33.9 g/dL (ref 30.0–36.0)
MCV: 88.8 fL (ref 80.0–100.0)
Platelets: 315 K/uL (ref 150–400)
RBC: 5.02 MIL/uL (ref 4.22–5.81)
RDW: 12.7 % (ref 11.5–15.5)
WBC: 13.1 K/uL — ABNORMAL HIGH (ref 4.0–10.5)
nRBC: 0 % (ref 0.0–0.2)

## 2024-01-09 LAB — BASIC METABOLIC PANEL WITH GFR
Anion gap: 10 (ref 5–15)
BUN: 15 mg/dL (ref 8–23)
CO2: 25 mmol/L (ref 22–32)
Calcium: 9.3 mg/dL (ref 8.9–10.3)
Chloride: 100 mmol/L (ref 98–111)
Creatinine, Ser: 0.76 mg/dL (ref 0.61–1.24)
GFR, Estimated: >60 mL/min (ref >60)
Glucose, Bld: 158 mg/dL — ABNORMAL HIGH (ref 70–99)
Potassium: 4.4 mmol/L (ref 3.5–5.1)
Sodium: 135 mmol/L (ref 135–145)

## 2024-01-09 LAB — HEMOGLOBIN A1C
Hgb A1c MFr Bld: 7.9 % — ABNORMAL HIGH (ref 4.8–5.6)
Mean Plasma Glucose: 180.03 mg/dL

## 2024-01-09 LAB — GLUCOSE, CAPILLARY: Glucose-Capillary: 174 mg/dL — ABNORMAL HIGH (ref 70–99)

## 2024-01-10 LAB — SURGICAL PCR SCREEN
MRSA, PCR: NEGATIVE
Staphylococcus aureus: POSITIVE — AB

## 2024-01-11 NOTE — Progress Notes (Signed)
 Request sent to Dr. DOROTHA Olmsted to view pt's pre op PCR from 01/09/24.

## 2024-01-11 NOTE — Progress Notes (Signed)
 Request sent to Dr. Josefina to review pt's pre op A1c from 01/09/24.

## 2024-01-12 ENCOUNTER — Other Ambulatory Visit (HOSPITAL_COMMUNITY): Payer: Self-pay

## 2024-01-12 ENCOUNTER — Other Ambulatory Visit (HOSPITAL_BASED_OUTPATIENT_CLINIC_OR_DEPARTMENT_OTHER): Payer: Self-pay

## 2024-01-15 NOTE — H&P (Signed)
 KNEE ARTHROPLASTY ADMISSION H&P  Patient ID: Alex Taylor MRN: [id number] DOB/AGE: 03/23/1960 63 y.o.  Chief Complaint: right knee pain.  Planned Procedure Date: 01/16/24 Medical Clearance by Benton Rio    HPI: Alex Taylor is a 63 y.o. male who presents for evaluation of right knee OA. The patient has a history of pain and functional disability in the right knee due to arthritis and has failed non-surgical conservative treatments for greater than 12 weeks to include NSAID's and/or analgesics, corticosteriod injections, viscosupplementation injections, and activity modification.  Onset of symptoms was gradual, starting 1 years ago with rapidlly worsening course since that time. The patient noted no past surgery on the right knee.  Patient currently rates pain at 8 out of 10 with activity. Patient has worsening of pain with activity and weight bearing and pain that interferes with activities of daily living.  Patient has evidence of joint space narrowing by imaging studies.  There is no active infection.  Past Medical History:  Diagnosis Date   Arthritis    Diabetes mellitus without complication (HCC)    History of kidney stones    Hypertension    Past Surgical History:  Procedure Laterality Date   APPENDECTOMY     BACK SURGERY     COLONOSCOPY  12/20/2011   Procedure: COLONOSCOPY;  Surgeon: Oneil DELENA Budge, MD;  Location: [facility];  Service: Gastroenterology;  Laterality: N/A;   FINGER SURGERY     KNEE ARTHROSCOPY     left knee   Allergies  Allergen Reactions   Codeine Other (See Comments)    makes me go stupid per patient   Penicillins Other (See Comments)    Childhood reaction   Prior to Admission medications   Medication Sig Start Date End Date Taking? Authorizing Provider  HYDROcodone -acetaminophen  (NORCO) 10-325 MG tablet Take 1 tablet by mouth 4 (four) times daily as needed. 04/21/23  Yes   insulin  degludec (TRESIBA  FLEXTOUCH) 100 UNIT/ML FlexTouch Pen Inject  40 Units into the skin at bedtime. 12/08/23  Yes Rio Benton PARAS, NP  insulin  lispro (HUMALOG  KWIKPEN) 200 UNIT/ML KwikPen Inject 5-11 Units into the skin 3 (three) times daily before meals. 09/01/23  Yes Rio Benton PARAS, NP  losartan  (COZAAR ) 50 MG tablet Take 1 tablet (50 mg total) by mouth daily. 04/21/23  Yes   lovastatin  (MEVACOR ) 10 MG tablet Take 1 tablet (10 mg total) by mouth every evening. Patient taking differently: Take 10 mg by mouth in the morning. 04/21/23  Yes   Multiple Vitamin (MULTIVITAMIN WITH MINERALS) TABS tablet Take 1 tablet by mouth in the morning.   Yes [provider]  glucose blood (ACCU-CHEK GUIDE) test strip Use as instructed to monitor glucose 2 times daily 07/31/20   Rio Benton PARAS, NP  Insulin  Pen Needle (PEN NEEDLES) 31G X 6 MM MISC Use to inject insulin  4 times daily 09/01/23   Rio Benton PARAS, NP  metFORMIN  (GLUCOPHAGE -XR) 500 MG 24 hr tablet Take 2 tablets (1,000 mg total) by mouth daily with breakfast. Patient not taking: Reported on 01/03/2024 09/01/23   Rio Benton PARAS, NP   Social History   Socioeconomic History   Marital status: Married    Spouse name: Not on file   Number of children: Not on file   Years of education: Not on file   Highest education level: Not on file  Occupational History   Not on file  Tobacco Use   Smoking status: Former   Smokeless tobacco: Never  Vaping Use   Vaping status: Never Used  Substance and Sexual Activity   Alcohol use: Yes    Alcohol/week: 1.0 standard drink of alcohol    Types: 1 Cans of beer per week    Comment: occas   Drug use: Yes    Types: Marijuana   Sexual activity: Not on file  Other Topics Concern   Not on file  Social History Narrative   Not on file   Social Drivers of Health   Financial Resource Strain: Not on file  Food Insecurity: Not on file  Transportation Needs: Not on file  Physical Activity: Not on file  Stress: Not on file  Social Connections: Not on file    Family History  Problem Relation Age of Onset   Cancer Mother    Osteoporosis Mother    Hypertension Father    Hyperlipidemia Father     ROS: Currently denies lightheadedness, dizziness, Fever, chills, CP, SOB.   No personal history of DVT, PE, MI, or CVA. No loose teeth or dentures All other systems have been reviewed and were otherwise currently negative with the exception of those mentioned in the HPI and as above.  Objective:  Physical Exam: General: Alert, NAD.  Antalgic Gait  HEENT: EOMI, Good Neck Extension  Pulm: No increased work of breathing.  Clear B/L A/P w/o crackle or wheeze.  CV: RRR, No m/g/r appreciated  GI: soft, NT, ND Neuro: Neuro without gross focal deficit.  Sensation intact distally Skin: No lesions in the area of chief complaint MSK/Surgical Site: right knee w/o redness or effusion.  + medial JLT. ROM 15-90.  5/5 strength in extension and flexion.  +EHL/FHL.  NVI.  Stable varus and valgus stress.    Imaging Review Plain radiographs demonstrate severe degenerative joint disease of the right knee.   Preoperative templating of the joint replacement has been completed, documented, and submitted to the Operating Room personnel in order to optimize intra-operative equipment management.  Assessment: right knee OA   Plan: Plan for Procedure(s): ARTHROPLASTY, KNEE, TOTAL  The patient history, physical exam, clinical judgement of the provider and imaging are consistent with end stage degenerative joint disease and total joint arthroplasty is deemed medically necessary. The treatment options including medical management, injection therapy, and arthroplasty were discussed at length. The risks and benefits of Procedure(s): ARTHROPLASTY, KNEE, TOTAL were presented and reviewed.  The risks of nonoperative treatment, versus surgical intervention including but not limited to continued pain, aseptic loosening, stiffness, dislocation/subluxation, infection, bleeding,  nerve injury, blood clots, cardiopulmonary complications, morbidity, mortality, among others were discussed. The patient verbalizes understanding and wishes to proceed with the plan.  Patient is being admitted for inpatient treatment for surgery, pain control, PT, prophylactic antibiotics, VTE prophylaxis, progressive ambulation, ADL's and discharge planning.   The patient does meet the criteria for TXA which will be used perioperatively.   ASA 325 mg will be used postoperatively for DVT prophylaxis in addition to SCDs, and early ambulation. The patient is planning to be discharged home with OPPT in care of wife   Alex Taylor 01/15/2024 12:49 PM

## 2024-01-16 ENCOUNTER — Ambulatory Visit (HOSPITAL_COMMUNITY): Admitting: Anesthesiology

## 2024-01-16 ENCOUNTER — Encounter (HOSPITAL_COMMUNITY)
Admission: RE | Disposition: A | Discharge status: Home / Self Care | Payer: Self-pay | Source: Ambulatory Visit | Attending: Orthopedic Surgery

## 2024-01-16 ENCOUNTER — Ambulatory Visit (HOSPITAL_COMMUNITY)

## 2024-01-16 ENCOUNTER — Other Ambulatory Visit (HOSPITAL_COMMUNITY): Payer: Self-pay

## 2024-01-16 ENCOUNTER — Ambulatory Visit (HOSPITAL_COMMUNITY): Payer: Self-pay | Admitting: Physician Assistant

## 2024-01-16 ENCOUNTER — Other Ambulatory Visit: Payer: Self-pay

## 2024-01-16 ENCOUNTER — Ambulatory Visit (HOSPITAL_COMMUNITY)
Admission: RE | Admit: 2024-01-16 | Discharge: 2024-01-16 | Disposition: A | Discharge status: Home / Self Care | Source: Ambulatory Visit | Attending: Orthopedic Surgery | Admitting: Orthopedic Surgery

## 2024-01-16 ENCOUNTER — Encounter (HOSPITAL_COMMUNITY): Payer: Self-pay | Admitting: Orthopedic Surgery

## 2024-01-16 DIAGNOSIS — Z471 Aftercare following joint replacement surgery: Secondary | ICD-10-CM | POA: Diagnosis not present

## 2024-01-16 DIAGNOSIS — Z794 Long term (current) use of insulin: Secondary | ICD-10-CM | POA: Insufficient documentation

## 2024-01-16 DIAGNOSIS — Z87891 Personal history of nicotine dependence: Secondary | ICD-10-CM | POA: Insufficient documentation

## 2024-01-16 DIAGNOSIS — G8918 Other acute postprocedural pain: Secondary | ICD-10-CM | POA: Diagnosis not present

## 2024-01-16 DIAGNOSIS — M25761 Osteophyte, right knee: Secondary | ICD-10-CM | POA: Diagnosis not present

## 2024-01-16 DIAGNOSIS — M1711 Unilateral primary osteoarthritis, right knee: Secondary | ICD-10-CM

## 2024-01-16 DIAGNOSIS — I10 Essential (primary) hypertension: Secondary | ICD-10-CM | POA: Insufficient documentation

## 2024-01-16 DIAGNOSIS — E119 Type 2 diabetes mellitus without complications: Secondary | ICD-10-CM

## 2024-01-16 DIAGNOSIS — Z96651 Presence of right artificial knee joint: Secondary | ICD-10-CM | POA: Diagnosis not present

## 2024-01-16 HISTORY — PX: TOTAL KNEE ARTHROPLASTY: SHX125

## 2024-01-16 LAB — GLUCOSE, CAPILLARY
Glucose-Capillary: 202 mg/dL — ABNORMAL HIGH (ref 70–99)
Glucose-Capillary: 143 mg/dL — ABNORMAL HIGH (ref 70–99)
Glucose-Capillary: 164 mg/dL — ABNORMAL HIGH (ref 70–99)

## 2024-01-16 LAB — HEMOGLOBIN A1C
Hgb A1c MFr Bld: 8 % — ABNORMAL HIGH (ref 4.8–5.6)
Mean Plasma Glucose: 183 mg/dL

## 2024-01-16 SURGERY — ARTHROPLASTY, KNEE, TOTAL
Anesthesia: Monitor Anesthesia Care | Site: Knee | Laterality: Right

## 2024-01-16 MED ORDER — PROPOFOL 500 MG/50ML IV EMUL
INTRAVENOUS | Status: DC | PRN
  Administered 2024-01-16: 80 mg via INTRAVENOUS
  Administered 2024-01-16: 40 mg via INTRAVENOUS
  Administered 2024-01-16: 85 ug/kg/min via INTRAVENOUS

## 2024-01-16 MED ORDER — FENTANYL CITRATE (PF) 50 MCG/ML IJ SOSY
50.0000 ug | PREFILLED_SYRINGE | Freq: Once | INTRAMUSCULAR | Status: AC
Start: 2024-01-16 — End: 2024-01-16
  Administered 2024-01-16: 50 ug via INTRAVENOUS
  Filled 2024-01-16: qty 2

## 2024-01-16 MED ORDER — METHOCARBAMOL 500 MG PO TABS
500.0000 mg | ORAL_TABLET | Freq: Three times a day (TID) | ORAL | 0 refills | Status: AC | PRN
  Filled 2024-01-16: qty 30, 10d supply, fill #0

## 2024-01-16 MED ORDER — CHLORHEXIDINE GLUCONATE 4 % EX SOLN
1.0000 | CUTANEOUS | 1 refills | Status: AC
  Filled 2024-01-16: qty 946, 30d supply, fill #0

## 2024-01-16 MED ORDER — TRANEXAMIC ACID-NACL 1000-0.7 MG/100ML-% IV SOLN
1000.0000 mg | INTRAVENOUS | Status: DC
  Filled 2024-01-16: qty 100

## 2024-01-16 MED ORDER — ONDANSETRON HCL 4 MG PO TABS
4.0000 mg | ORAL_TABLET | Freq: Three times a day (TID) | ORAL | 0 refills | Status: AC | PRN
  Filled 2024-01-16: qty 10, 4d supply, fill #0

## 2024-01-16 MED ORDER — OXYCODONE HCL 5 MG/5ML PO SOLN: 5.0000 mg | Freq: Once | ORAL | Status: AC | PRN

## 2024-01-16 MED ORDER — BUPIVACAINE HCL 0.25 % IJ SOLN
INTRAMUSCULAR | Status: DC | PRN
  Administered 2024-01-16: 30 mL

## 2024-01-16 MED ORDER — ROPIVACAINE HCL 5 MG/ML IJ SOLN
INTRAMUSCULAR | Status: DC | PRN
  Administered 2024-01-16: 25 mL via PERINEURAL

## 2024-01-16 MED ORDER — ACETAMINOPHEN 500 MG PO TABS
1000.0000 mg | ORAL_TABLET | Freq: Once | ORAL | Status: AC
  Administered 2024-01-16: 1000 mg via ORAL

## 2024-01-16 MED ORDER — PROPOFOL 10 MG/ML IV BOLUS
INTRAVENOUS | Status: AC
  Filled 2024-01-16: qty 20

## 2024-01-16 MED ORDER — POVIDONE-IODINE 7.5 % EX SOLN: Freq: Once | CUTANEOUS | Status: DC

## 2024-01-16 MED ORDER — OXYCODONE HCL 10 MG PO TABS
5.0000 mg | ORAL_TABLET | ORAL | 0 refills | Status: AC | PRN
  Filled 2024-01-16: qty 30, 5d supply, fill #0

## 2024-01-16 MED ORDER — 0.9 % SODIUM CHLORIDE (POUR BTL) OPTIME
TOPICAL | Status: DC | PRN
  Administered 2024-01-16: 1000 mL

## 2024-01-16 MED ORDER — OXYCODONE HCL 5 MG PO TABS
5.0000 mg | ORAL_TABLET | Freq: Once | ORAL | Status: AC
  Administered 2024-01-16: 5 mg via ORAL

## 2024-01-16 MED ORDER — FENTANYL CITRATE (PF) 50 MCG/ML IJ SOSY: 25.0000 ug | PREFILLED_SYRINGE | INTRAMUSCULAR | Status: DC | PRN

## 2024-01-16 MED ORDER — PROPOFOL 1000 MG/100ML IV EMUL
INTRAVENOUS | Status: AC
  Filled 2024-01-16: qty 100

## 2024-01-16 MED ORDER — ASPIRIN 325 MG PO TBEC
325.0000 mg | DELAYED_RELEASE_TABLET | Freq: Two times a day (BID) | ORAL | 0 refills | Status: AC
  Filled 2024-01-16: qty 60, 30d supply, fill #0

## 2024-01-16 MED ORDER — SENNA-DOCUSATE SODIUM 8.6-50 MG PO TABS
2.0000 | ORAL_TABLET | Freq: Every day | ORAL | 1 refills | Status: AC
Start: 2024-01-16 — End: ?
  Filled 2024-01-16: qty 30, 15d supply, fill #0

## 2024-01-16 MED ORDER — SODIUM CHLORIDE 0.9 % IR SOLN
Status: DC | PRN
  Administered 2024-01-16: 1000 mL

## 2024-01-16 MED ORDER — LACTATED RINGERS IV BOLUS: 250.0000 mL | Freq: Once | INTRAVENOUS | Status: DC

## 2024-01-16 MED ORDER — METHOCARBAMOL 500 MG PO TABS
ORAL_TABLET | ORAL | Status: AC
Start: 2024-01-16 — End: 2024-01-16
  Filled 2024-01-16: qty 1

## 2024-01-16 MED ORDER — METHOCARBAMOL 1000 MG/10ML IJ SOLN
500.0000 mg | Freq: Four times a day (QID) | INTRAMUSCULAR | Status: DC | PRN
Start: 2024-01-16 — End: 2024-01-16
  Administered 2024-01-16: 500 mg via INTRAVENOUS

## 2024-01-16 MED ORDER — KETOROLAC TROMETHAMINE 30 MG/ML IJ SOLN
INTRAMUSCULAR | Status: DC | PRN
  Administered 2024-01-16: 30 mg via INTRAMUSCULAR

## 2024-01-16 MED ORDER — TRANEXAMIC ACID-NACL 1000-0.7 MG/100ML-% IV SOLN
1000.0000 mg | Freq: Once | INTRAVENOUS | Status: AC
  Administered 2024-01-16: 1000 mg via INTRAVENOUS

## 2024-01-16 MED ORDER — OXYCODONE HCL 5 MG PO TABS
5.0000 mg | ORAL_TABLET | Freq: Once | ORAL | Status: AC | PRN
  Administered 2024-01-16: 5 mg via ORAL

## 2024-01-16 MED ORDER — KETOROLAC TROMETHAMINE 30 MG/ML IJ SOLN
INTRAMUSCULAR | Status: AC
Start: 2024-01-16 — End: 2024-01-16
  Filled 2024-01-16: qty 1

## 2024-01-16 MED ORDER — CEFAZOLIN SODIUM-DEXTROSE 2-4 GM/100ML-% IV SOLN
2.0000 g | Freq: Four times a day (QID) | INTRAVENOUS | Status: DC
  Administered 2024-01-16: 2 g via INTRAVENOUS

## 2024-01-16 MED ORDER — BUPIVACAINE IN DEXTROSE 0.75-8.25 % IT SOLN
INTRATHECAL | Status: DC | PRN
  Administered 2024-01-16: 2 mL via INTRATHECAL

## 2024-01-16 MED ORDER — ACETAMINOPHEN 500 MG PO TABS
ORAL_TABLET | ORAL | Status: AC
  Filled 2024-01-16: qty 2

## 2024-01-16 MED ORDER — ACETAMINOPHEN 500 MG PO TABS
1000.0000 mg | ORAL_TABLET | Freq: Once | ORAL | Status: AC
  Administered 2024-01-16: 1000 mg via ORAL
  Filled 2024-01-16: qty 2

## 2024-01-16 MED ORDER — ORAL CARE MOUTH RINSE: 15.0000 mL | Freq: Once | OROMUCOSAL | Status: AC

## 2024-01-16 MED ORDER — MIDAZOLAM HCL (PF) 2 MG/2ML IJ SOLN
1.0000 mg | Freq: Once | INTRAMUSCULAR | Status: AC
  Administered 2024-01-16: 1 mg via INTRAVENOUS
  Filled 2024-01-16: qty 2

## 2024-01-16 MED ORDER — POVIDONE-IODINE 10 % EX SWAB: 2.0000 | Freq: Once | CUTANEOUS | Status: DC

## 2024-01-16 MED ORDER — CEFAZOLIN SODIUM-DEXTROSE 2-4 GM/100ML-% IV SOLN
2.0000 g | INTRAVENOUS | Status: AC
  Administered 2024-01-16: 2 g via INTRAVENOUS
  Filled 2024-01-16: qty 100

## 2024-01-16 MED ORDER — LACTATED RINGERS IV SOLN: INTRAVENOUS | Status: DC

## 2024-01-16 MED ORDER — ONDANSETRON HCL 4 MG/2ML IJ SOLN: 4.0000 mg | Freq: Four times a day (QID) | INTRAMUSCULAR | Status: DC | PRN

## 2024-01-16 MED ORDER — EPHEDRINE SULFATE (PRESSORS) 25 MG/5ML IV SOSY
PREFILLED_SYRINGE | INTRAVENOUS | Status: DC | PRN
  Administered 2024-01-16: 5 mg via INTRAVENOUS
  Administered 2024-01-16: 10 mg via INTRAVENOUS

## 2024-01-16 MED ORDER — WATER FOR IRRIGATION, STERILE IR SOLN
Status: DC | PRN
  Administered 2024-01-16: 2000 mL

## 2024-01-16 MED ORDER — CEFAZOLIN SODIUM-DEXTROSE 1-4 GM/50ML-% IV SOLN
INTRAVENOUS | Status: AC
Start: 2024-01-16 — End: 2024-01-16
  Filled 2024-01-16: qty 50

## 2024-01-16 MED ORDER — OXYCODONE HCL 5 MG PO TABS
ORAL_TABLET | ORAL | Status: AC
  Filled 2024-01-16: qty 1

## 2024-01-16 MED ORDER — CHLORHEXIDINE GLUCONATE 0.12 % MT SOLN
15.0000 mL | Freq: Once | OROMUCOSAL | Status: AC
  Administered 2024-01-16: 15 mL via OROMUCOSAL

## 2024-01-16 MED ORDER — MUPIROCIN 2 % EX OINT
1.0000 | TOPICAL_OINTMENT | Freq: Two times a day (BID) | CUTANEOUS | 0 refills | Status: AC
  Filled 2024-01-16: qty 66, 33d supply, fill #0
  Filled 2024-01-16: qty 22, 11d supply, fill #0

## 2024-01-16 MED ORDER — INSULIN ASPART 100 UNIT/ML IJ SOLN
0.0000 [IU] | INTRAMUSCULAR | Status: DC | PRN
  Administered 2024-01-16: 4 [IU] via SUBCUTANEOUS
  Filled 2024-01-16: qty 4

## 2024-01-16 MED ORDER — TRANEXAMIC ACID-NACL 1000-0.7 MG/100ML-% IV SOLN
INTRAVENOUS | Status: AC
Start: 2024-01-16 — End: 2024-01-16
  Filled 2024-01-16: qty 100

## 2024-01-16 MED ORDER — PHENYLEPHRINE HCL (PRESSORS) 10 MG/ML IV SOLN
INTRAVENOUS | Status: DC | PRN
  Administered 2024-01-16 (×4): 80 ug via INTRAVENOUS

## 2024-01-16 MED ORDER — BUPIVACAINE HCL (PF) 0.25 % IJ SOLN
INTRAMUSCULAR | Status: AC
  Filled 2024-01-16: qty 30

## 2024-01-16 MED ORDER — LACTATED RINGERS IV BOLUS: 500.0000 mL | Freq: Once | INTRAVENOUS | Status: DC

## 2024-01-16 MED ORDER — METHOCARBAMOL 500 MG PO TABS: 500.0000 mg | ORAL_TABLET | Freq: Four times a day (QID) | ORAL | Status: DC | PRN

## 2024-01-16 SURGICAL SUPPLY — 52 items
ATTUNE PSFEM RTSZ6 NARCEM KNEE (Femur) IMPLANT
BAG COUNTER SPONGE SURGICOUNT (BAG) IMPLANT
BAG ZIPLOCK 12X15 (MISCELLANEOUS) IMPLANT
BASEPLATE TIB CMT FB PCKT SZ5 (Knees) IMPLANT
BLADE SAG 18X100X1.27 (BLADE) ×1 IMPLANT
BLADE SAW SGTL 11.0X1.19X90.0M (BLADE) IMPLANT
BLADE SAW SGTL 13X75X1.27 (BLADE) ×1 IMPLANT
BLADE SURG 15 STRL LF DISP TIS (BLADE) ×1 IMPLANT
BNDG ELASTIC 6X10 VLCR STRL LF (GAUZE/BANDAGES/DRESSINGS) ×1 IMPLANT
BOWL SMART MIX CTS (DISPOSABLE) ×1 IMPLANT
CEMENT HV SMART SET (Cement) ×2 IMPLANT
CLSR STERI-STRIP ANTIMIC 1/2X4 (GAUZE/BANDAGES/DRESSINGS) ×2 IMPLANT
COVER SURGICAL LIGHT HANDLE (MISCELLANEOUS) ×1 IMPLANT
CUFF TRNQT CYL 34X4.125X (TOURNIQUET CUFF) ×1 IMPLANT
DERMABOND ADVANCED .7 DNX12 (GAUZE/BANDAGES/DRESSINGS) IMPLANT
DRAPE SHEET LG 3/4 BI-LAMINATE (DRAPES) ×1 IMPLANT
DRAPE U-SHAPE 47X51 STRL (DRAPES) ×1 IMPLANT
DRSG MEPILEX POST OP 4X12 (GAUZE/BANDAGES/DRESSINGS) ×1 IMPLANT
DURAPREP 26ML APPLICATOR (WOUND CARE) ×2 IMPLANT
ELECT REM PT RETURN 15FT ADLT (MISCELLANEOUS) ×1 IMPLANT
GAUZE PAD ABD 8X10 STRL (GAUZE/BANDAGES/DRESSINGS) ×2 IMPLANT
GLOVE BIO SURGEON STRL SZ 6.5 (GLOVE) ×1 IMPLANT
GLOVE BIO SURGEON STRL SZ7.5 (GLOVE) ×1 IMPLANT
GLOVE BIOGEL PI IND STRL 7.0 (GLOVE) ×1 IMPLANT
GLOVE BIOGEL PI IND STRL 8 (GLOVE) ×1 IMPLANT
GOWN STRL SURGICAL XL XLNG (GOWN DISPOSABLE) ×2 IMPLANT
HOLDER FOLEY CATH W/STRAP (MISCELLANEOUS) ×1 IMPLANT
IMMOBILIZER KNEE 20 THIGH 36 (SOFTGOODS) ×1 IMPLANT
INSERT TIB PS FB ATTUNE SZ6X5 (Knees) IMPLANT
KIT TURNOVER KIT A (KITS) ×1 IMPLANT
MANIFOLD NEPTUNE II (INSTRUMENTS) ×1 IMPLANT
NDL SAFETY ECLIPSE 18X1.5 (NEEDLE) ×1 IMPLANT
NS IRRIG 1000ML POUR BTL (IV SOLUTION) ×1 IMPLANT
PACK TOTAL KNEE CUSTOM (KITS) ×1 IMPLANT
PATELLA MEDIAL ATTUN 35MM KNEE (Knees) IMPLANT
PENCIL SMOKE EVACUATOR (MISCELLANEOUS) ×1 IMPLANT
PIN STEINMAN FIXATION KNEE (PIN) IMPLANT
PROTECTOR NERVE ULNAR (MISCELLANEOUS) ×1 IMPLANT
SET HNDPC FAN SPRY TIP SCT (DISPOSABLE) ×1 IMPLANT
SET PAD KNEE POSITIONER (MISCELLANEOUS) ×1 IMPLANT
SPIKE FLUID TRANSFER (MISCELLANEOUS) IMPLANT
SUT MNCRL AB 3-0 PS2 18 (SUTURE) IMPLANT
SUT STRATAFIX PDS+ 0 24IN (SUTURE) ×1 IMPLANT
SUT VIC AB 2-0 CT1 TAPERPNT 27 (SUTURE) ×2 IMPLANT
SUT VIC AB 3-0 SH 27X BRD (SUTURE) ×2 IMPLANT
SUTURE STRATFX MNCRL+ 3-0 PS-2 (SUTURE) ×1 IMPLANT
SYR 3ML LL SCALE MARK (SYRINGE) ×1 IMPLANT
TOWEL GREEN STERILE FF (TOWEL DISPOSABLE) ×1 IMPLANT
TRAY FOLEY MTR SLVR 16FR STAT (SET/KITS/TRAYS/PACK) ×1 IMPLANT
TUBE SUCTION HIGH CAP CLEAR NV (SUCTIONS) ×1 IMPLANT
WATER STERILE IRR 1000ML POUR (IV SOLUTION) ×2 IMPLANT
WRAP KNEE MAXI GEL POST OP (GAUZE/BANDAGES/DRESSINGS) ×1 IMPLANT

## 2024-01-16 NOTE — Anesthesia Preprocedure Evaluation (Signed)
 Anesthesia Evaluation  Patient identified by MRN, date of birth, ID band Patient awake    Reviewed: Allergy & Precautions, H&P , NPO status , Patient's Chart, lab work & pertinent test results  Airway Mallampati: II   Neck ROM: full    Dental   Pulmonary former smoker   breath sounds clear to auscultation       Cardiovascular hypertension,  Rhythm:regular Rate:Normal     Neuro/Psych    GI/Hepatic   Endo/Other  diabetes, Type 2    Renal/GU      Musculoskeletal  (+) Arthritis ,    Abdominal   Peds  Hematology   Anesthesia Other Findings   Reproductive/Obstetrics                              Anesthesia Physical Anesthesia Plan  ASA: 2  Anesthesia Plan: MAC and Spinal   Post-op Pain Management: Regional block*   Induction: Intravenous  PONV Risk Score and Plan: 1 and Propofol  infusion, Midazolam , Ondansetron  and Treatment may vary due to age or medical condition  Airway Management Planned: Simple Face Mask  Additional Equipment:   Intra-op Plan:   Post-operative Plan:   Informed Consent: I have reviewed the patients History and Physical, chart, labs and discussed the procedure including the risks, benefits and alternatives for the proposed anesthesia with the patient or authorized representative who has indicated his/her understanding and acceptance.     Dental advisory given  Plan Discussed with: CRNA, Anesthesiologist and Surgeon  Anesthesia Plan Comments:         Anesthesia Quick Evaluation

## 2024-01-16 NOTE — Anesthesia Procedure Notes (Signed)
 Anesthesia Regional Block: Adductor canal block   Pre-Anesthetic Checklist: , timeout performed,  Correct Patient, Correct Site, Correct Laterality,  Correct Procedure, Correct Position, site marked,  Risks and benefits discussed,  Surgical consent,  Pre-op evaluation,  At surgeon's request and post-op pain management  Laterality: Right  Prep: chloraprep       Needles:  Injection technique: Single-shot  Needle Type: Echogenic Needle     Needle Length: 9cm  Needle Gauge: 21     Additional Needles:   Narrative:  Start time: 01/16/2024 9:04 AM End time: 01/16/2024 9:12 AM Injection made incrementally with aspirations every 5 mL.  Performed by: Personally  Anesthesiologist: Maryclare Cornet, MD  Additional Notes: Pt tolerated the procedure well.

## 2024-01-16 NOTE — Transfer of Care (Signed)
 Immediate Anesthesia Transfer of Care Note  Patient: Alex Taylor  Procedure(s) Performed: ARTHROPLASTY, KNEE, TOTAL (Right: Knee)  Patient Location: PACU  Anesthesia Type:MAC, Regional, and Spinal  Level of Consciousness: awake, alert , oriented, and patient cooperative  Airway & Oxygen Therapy: Patient Spontanous Breathing  Post-op Assessment: Report given to RN and Post -op Vital signs reviewed and stable  Post vital signs: Reviewed and stable  Last Vitals:  Vitals Value Taken Time  BP 114/79   Temp    Pulse 70 01/16/24 12:29  Resp 17 01/16/24 12:29  SpO2 94 % 01/16/24 12:29  Vitals shown include unfiled device data.  Last Pain:  Vitals:   01/16/24 0910  PainSc: 0-No pain         Complications: No notable events documented.

## 2024-01-16 NOTE — Evaluation (Signed)
 Physical Therapy Evaluation Patient Details Name: Alex Taylor MRN: [id number] DOB: Sep 03, 1960 Today's Date: 01/16/2024  History of Present Illness  63 yo male presents to therapy s/p R TKA on 01/16/2024 due to failure of conservative measures. Pt PMH includes but is not limited to: DM II, back surgery, arthritis, kidney stones and HTN.  Clinical Impression    Alex Taylor is a 63 y.o. male POD 0 s/p R TKA. Patient reports IND with mobility at baseline. Patient is now limited by functional impairments (see PT problem list below) and requires CGA for transfers and gait with RW. Patient was able to ambulate 55 and 20 feet with RW and CGA and cues for safe walker management. Patient educated on safe sequencing for stair mobility with use of RW and retrograde stepping pattern, fall risk prevention, proper R LE positioning to facilitate extension and improved anatomical R foot placement, use of CP/ice, pain management and goal, slowly increasing activity levels pt and spouse verbalized safe guarding position for people assisting with mobility. Patient instructed in exercises to facilitate ROM and circulation reviewed and HO provided. Patient will benefit from continued skilled PT interventions to address impairments and progress towards PLOF. Patient has met mobility goals at adequate level for discharge home with family support and OPPT services; will continue to follow if pt continues acute stay to progress towards Mod I goals.       If plan is discharge home, recommend the following: A little help with walking and/or transfers;A little help with bathing/dressing/bathroom;Assistance with cooking/housework;Assist for transportation;Help with stairs or ramp for entrance   Can travel by private vehicle        Equipment Recommendations Rolling walker (2 wheels)  Recommendations for Other Services       Functional Status Assessment Patient has had a recent decline in their functional status and  demonstrates the ability to make significant improvements in function in a reasonable and predictable amount of time.     Precautions / Restrictions Precautions Precautions: Knee;Fall Restrictions Weight Bearing Restrictions Per Provider Order: No      Mobility  Bed Mobility Overal bed mobility: Needs Assistance Bed Mobility: Supine to Sit     Supine to sit: Supervision     General bed mobility comments: min cues    Transfers Overall transfer level: Needs assistance Equipment used: Rolling walker (2 wheels) Transfers: Sit to/from Stand Sit to Stand: Contact guard assist           General transfer comment: min cues    Ambulation/Gait Ambulation/Gait assistance: Contact guard assist Gait Distance (Feet): 55 Feet Assistive device: Rolling walker (2 wheels) Gait Pattern/deviations: Step-to pattern, Decreased stance time - right, Antalgic, Trunk flexed Gait velocity: decreased     General Gait Details: min cues for RW management  Stairs Stairs: Yes Stairs assistance: Contact guard assist Stair Management: Two rails, With walker Number of Stairs: 2 General stair comments: 2 steps with B handrail with RW, CGA and cues for safety sequencing and step to pattern and then progressing to step navigation with use of RW and retrograde stepping pattern with use of RW and cues, spouse observed and reported that son would be avalible to assist when pt arrives at home  Wheelchair Mobility     Tilt Bed    Modified Rankin (Stroke Patients Only)       Balance Overall balance assessment: Needs assistance Sitting-balance support: Feet supported Sitting balance-Leahy Scale: Good     Standing balance support: Bilateral upper extremity  supported, During functional activity, Reliant on assistive device for balance Standing balance-Leahy Scale: Poor                               Pertinent Vitals/Pain Pain Assessment Pain Assessment: 0-10 Pain Score: 6   Pain Location: R knee and LE Pain Descriptors / Indicators: Aching, Constant, Discomfort, Dull, Grimacing, Operative site guarding, Throbbing, Spasm Pain Intervention(s): Limited activity within patient's tolerance, Monitored during session, Premedicated before session, Repositioned, Ice applied    Home Living Family/patient expects to be discharged to:: Private residence Living Arrangements: Spouse/significant other Available Help at Discharge: Family Type of Home: House Home Access: Stairs to enter Entrance Stairs-Rails: None Entrance Stairs-Number of Steps: 2   Home Layout: One level Home Equipment: Tub bench;Cane - single point;Toilet riser Additional Comments: walking stick, ice man machine    Prior Function Prior Level of Function : Independent/Modified Independent;Working/employed;Driving             Mobility Comments: mod I with use of walking stick for all ADLs, self care tasks an dIADLs       Extremity/Trunk Assessment        Lower Extremity Assessment Lower Extremity Assessment: RLE deficits/detail RLE Deficits / Details: ankle DF/PF 5/5; SLR < 10 degree lag RLE Sensation: decreased light touch (buttocks abn sensation)    Cervical / Trunk Assessment Cervical / Trunk Assessment: Back Surgery  Communication   Communication Communication: No apparent difficulties    Cognition Arousal: Alert Behavior During Therapy: WFL for tasks assessed/performed   PT - Cognitive impairments: No apparent impairments                         Following commands: Intact       Cueing       General Comments      Exercises Total Joint Exercises Ankle Circles/Pumps: AROM, Both, 15 reps Quad Sets: AROM, Right, 5 reps Short Arc Quad: AROM, Right, 5 reps Heel Slides: AROM, Right, 5 reps Hip ABduction/ADduction: AROM, Right, 5 reps Straight Leg Raises: AROM, Right, 5 reps Knee Flexion: AROM, Right, 5 reps, Seated   Assessment/Plan    PT Assessment  Patient needs continued PT services  PT Problem List Decreased strength;Decreased range of motion;Decreased activity tolerance;Decreased balance;Decreased mobility;Decreased coordination;Pain       PT Treatment Interventions DME instruction;Gait training;Stair training;Functional mobility training;Therapeutic activities;Therapeutic exercise;Balance training;Neuromuscular re-education;Patient/family education;Modalities    PT Goals (Current goals can be found in the Care Plan section)  Acute Rehab PT Goals Patient Stated Goal: do everything I was before, get back on the golf course PT Goal Formulation: With patient Time For Goal Achievement: 01/30/24 Potential to Achieve Goals: Good    Frequency 7X/week     Co-evaluation               AM-PAC PT 6 Clicks Mobility  Outcome Measure Help needed turning from your back to your side while in a flat bed without using bedrails?: None Help needed moving from lying on your back to sitting on the side of a flat bed without using bedrails?: A Little Help needed moving to and from a bed to a chair (including a wheelchair)?: A Little Help needed standing up from a chair using your arms (e.g., wheelchair or bedside chair)?: A Little Help needed to walk in hospital room?: A Little Help needed climbing 3-5 steps with a railing? : A Little 6 Click  Score: 19    End of Session Equipment Utilized During Treatment: Gait belt Activity Tolerance: Patient tolerated treatment well;No increased pain Patient left: in chair;with call bell/phone within reach;with family/visitor present Nurse Communication: Mobility status;Other (comment) (pt readiness for same day d/c from PT standpoint) PT Visit Diagnosis: Unsteadiness on feet (R26.81);Other abnormalities of gait and mobility (R26.89);Muscle weakness (generalized) (M62.81);Difficulty in walking, not elsewhere classified (R26.2);Pain Pain - Right/Left: Right Pain - part of body: Knee;Leg    Time:  8376-8293 PT Time Calculation (min) (ACUTE ONLY): 43 min   Charges:   PT Evaluation $PT Eval Low Complexity: 1 Low PT Treatments $Gait Training: 8-22 mins $Therapeutic Exercise: 8-22 mins PT General Charges $$ ACUTE PT VISIT: 1 Visit         [place], PT Acute Rehab   [place] VEAR Drone 01/16/2024, 5:46 PM

## 2024-01-16 NOTE — Therapy (Signed)
 OUTPATIENT PHYSICAL THERAPY LOWER EXTREMITY EVALUATION   Patient Name: Alex Taylor MRN: [id number] DOB:21-Jul-1960, 63 y.o., male Today's Date: 01/22/2024  END OF SESSION:  PT End of Session - 01/22/24 1458     Visit Number 1    Number of Visits 16    Date for Recertification  02/19/24    Authorization Type Generic [employer]    Authorization Time Period No auth    Progress Note Due on Visit 10    PT Start Time 1459    PT Stop Time 1545    PT Time Calculation (min) 46 min    Activity Tolerance Patient tolerated treatment well    Behavior During Therapy WFL for tasks assessed/performed          Past Medical History:  Diagnosis Date   Arthritis    Diabetes mellitus without complication (HCC)    History of kidney stones    Hypertension    Past Surgical History:  Procedure Laterality Date   APPENDECTOMY     BACK SURGERY     COLONOSCOPY  12/20/2011   Procedure: COLONOSCOPY;  Surgeon: Oneil Taylor Budge, MD;  Location: [facility];  Service: Gastroenterology;  Laterality: N/A;   FINGER SURGERY     KNEE ARTHROSCOPY     left knee   TOTAL KNEE ARTHROPLASTY Right 01/16/2024   Procedure: ARTHROPLASTY, KNEE, TOTAL;  Surgeon: Alex Chew, MD;  Location: [facility];  Service: Orthopedics;  Laterality: Right;   Patient Active Problem List   Diagnosis Date Noted   DM (diabetes mellitus) (HCC) 03/21/2012   Difficulty walking 10/28/2011   Open wound of hand except fingers 05/29/2008   Open wound of finger with tendon involvement 05/29/2008    PCP: Alex Longs, PA-C  REFERRING PROVIDER: Josefina Chew, MD  REFERRING DIAG:  [phone] (ICD-10-CM) - Presence of right artificial knee joint    THERAPY DIAG:  Decreased range of motion (ROM) of right knee  Impaired functional mobility, balance, gait, and endurance  Acute pain of right knee  Decreased strength of lower extremity  Rationale for Evaluation and Treatment: Rehabilitation  ONSET DATE: 01/16/24  SUBJECTIVE:    SUBJECTIVE STATEMENT: Patient reports having surgery on 11/25. Reports he's tried some exercises at home that they gave him from hospital but pain and swelling restricting him some. Didn't get home therapy, and no CPM machine.    PERTINENT HISTORY: Alex Taylor is a  63 y.o. male with a diagnosis of right knee OA who failed conservative measures and elected for surgical management.  He actually had worse radiographic disease on the left knee than the right knee, but his right knee pain was severe and he had bone marrow edema on the medial and patellofemoral joint seen on MRI.  We discussed the possibility of injections, observation, more time, versus arthroscopic treatment, versus partial knee replacement versus total knee replacement, and the patient wished for total knee replacement. PAIN:  Are you having pain? Yes: NPRS scale: 8 Pain location: Entire R knee Pain description: Achy Aggravating factors: Walking, movement Relieving factors: Rest, medications some, Ice  PRECAUTIONS: None  RED FLAGS: None   WEIGHT BEARING RESTRICTIONS: No  FALLS:  Has patient fallen in last 6 months? 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  DIAGNOSTIC FINDINGS:  IMPRESSION: 1. Right total knee prosthesis in place without visible periprosthetic fracture or acute bony findings. 2. Expected gas in the joint and soft tissues. 3. No complicating feature on this 2 view series.  PATIENT SURVEYS:  LEFS  Extreme  difficulty/unable (0), Quite a bit of difficulty (1), Moderate difficulty (2), Little difficulty (3), No difficulty (4) Survey date:    Any of your usual work, housework or school activities   2. Usual hobbies, recreational or sporting activities   3. Getting into/out of the bath   4. Walking between rooms   5. Putting on socks/shoes   6. Squatting    7. Lifting an object, like a bag of groceries from the floor   8. Performing light activities around your home   9. Performing heavy activities around your home   10. Getting into/out of a car   11. Walking 2 blocks   12. Walking 1 mile   13. Going up/down 10 stairs (1 flight)   14. Standing for 1 hour   15.  sitting for 1 hour   16. Running on even ground   17. Running on uneven ground   18. Making sharp turns while running fast   19. Hopping    20. Rolling over in bed   Score total:  Lower Extremity Functional Score: 5 / 80 = 6.3 %      COGNITION: Overall cognitive status: Within functional limits for tasks assessed     SENSATION: Light touch: WFL  EDEMA:  Mild-moderate edema in R knee joint and R gastroc muscle R  PALPATION: TTP on medial and lateral poles of R knee joint   LOWER EXTREMITY ROM:  Active ROM Right eval Left eval  Hip flexion    Hip extension    Hip abduction    Hip adduction    Hip internal rotation    Hip external rotation    Knee flexion 66 WFL  Knee extension -10 0  Ankle dorsiflexion    Ankle plantarflexion    Ankle inversion    Ankle eversion     (Blank rows = not tested)  LOWER EXTREMITY MMT:  MMT Right eval Left eval  Hip flexion    Hip extension    Hip abduction    Hip adduction    Hip internal rotation    Hip external rotation    Knee flexion    Knee extension    Ankle dorsiflexion    Ankle plantarflexion    Ankle inversion    Ankle eversion     (Blank rows = not tested)  LOWER EXTREMITY SPECIAL TESTS:    FUNCTIONAL TESTS:  5 times sit to stand: 42 seconds Timed  up and go (TUG):  54 seconds w/ RW 2 minute walk test: Next Session   GAIT: Distance walked: at least 100 ft in session Assistive device utilized: Walker - 2 wheeled Level of assistance: Modified independence Comments: Ambulates with RW, dec weight shift/stance time w/ RLE  -nearly TTWB, dec velocity  TREATMENT DATE:  01/22/24: PT Eval and HEP    PATIENT EDUCATION:  Education details: PT evaluation, objective findings, POC, Importance of HEP, Precautions, Clinic policies  Person educated: Patient Education method: Explanation and Demonstration Education comprehension: verbalized understanding and returned demonstration  HOME EXERCISE PROGRAM: Access Code: [email or web address] URL: [email or web address] Date: 01/22/2024 Prepared by: Rosaria Powell-Butler  Exercises - Supine Ankle Pumps  - 3 x daily - 7 x weekly - 3 sets - 10 reps - Quad Setting and Stretching  - 10 x daily - 7 x weekly - 3 sets - 10 reps - 5-10 hold - Supine Heel Slide with Strap  - 2 x daily - 7 x weekly - 3 sets - 10 reps - 5-10 hold - Sit to Stand Without Arm Support  - 2 x daily - 7 x weekly - 3 sets - 10 reps - Seated Heel Slide  - 2 x daily - 7 x weekly - 3 sets - 10 reps - 5-10 hold  ASSESSMENT:  CLINICAL IMPRESSION: Patient is a 63 y.o. male who was seen today for physical therapy evaluation and treatment for  Z96.651 (ICD-10-CM) - Presence of right artificial knee joint  . On this date, patient demonstrates Impaired self perception of function, decreased ROM in R knee, decreased strength in RLE, impaired balance, and increased pain all of which may be contributing to patient's altered gait, difficulty with functional transfers, increased fall risk, difficulty with completing ADLs, and decreased endurance/activity tolerance. Educated on signs of DVT, appropriate resting positions  for knee (can prop knee up as long as ankle is higher than knee to prevent risk of contractures), and importance of compression, and elevation. PT and wife report understanding of all. Patient will benefit from continued skilled physical therapy in order to address the above deficits and improve overall function.   OBJECTIVE IMPAIRMENTS: Abnormal gait, decreased activity tolerance, decreased balance, decreased endurance, decreased mobility, difficulty walking, decreased ROM, decreased strength, increased edema, impaired perceived functional ability, impaired flexibility, improper body mechanics, postural dysfunction, and pain.   ACTIVITY LIMITATIONS: carrying, lifting, bending, sitting, standing, squatting, stairs, transfers, bed mobility, bathing, and dressing  PARTICIPATION LIMITATIONS: meal prep, cleaning, laundry, driving, community activity, occupation, and yard work  PERSONAL FACTORS: N/A are also affecting patient's functional outcome.   REHAB POTENTIAL: Good  CLINICAL DECISION MAKING: Stable/uncomplicated  EVALUATION COMPLEXITY: Low   GOALS: Goals reviewed with patient? No  SHORT TERM GOALS: Target date: 02/23/23 Patient will be independent with performance of HEP to demonstrate adequate self management of symptoms.  Baseline:  Goal status: INITIAL  2.   Patient will report at least a 25% improvement with function and/or pain reduction overall since beginning PT. Baseline:  Goal status: INITIAL   LONG TERM GOALS: Target date: 03/23/23 Patient will improve LEFS score by 9 points to demonstrate improved perceived function while meeting MCID.  Baseline: Goal status: INITIAL 2.  Patient will improve  R knee flexion  ROM to at least 120 degrees to demonstrate improved LE mobility needed for functional transfers and gait mechanics.  Baseline:  Goal status: INITIAL 3.  Patient will improve  R knee extension  ROM to at least 0 degrees to demonstrate improved LE mobility needed for  functional transfers and gait mechanics.  Baseline:  Goal status: INITIAL 4.  Patient will improve 5 times sit to stand test by at least 12 seconds to demonstrate increased LE strength and/or power needed to improve functional transfers.  Baseline:  Goal status: INITIAL  5.  Patient will improve TUG score to 12 seconds or less with least restrictive assistive device to demonstrate improved fall risk.  Baseline: Goal status: INITIAL    PLAN:  PT FREQUENCY: 2x/week  PT DURATION: 8 weeks  PLANNED INTERVENTIONS: 97164- PT Re-evaluation, 97110-Therapeutic exercises, 97530- Therapeutic activity, [id number]- Neuromuscular re-education, 97535- Self Care, [id number]- Manual therapy, [id number]- Gait training, [phone]- Electrical stimulation (manual), [id number]- Ultrasound, 02987- Traction (mechanical), [phone] (1-2 muscles), 20561 (3+ muscles)- Dry Needling, Patient/Family education, Balance training, Stair training, Taping, Joint mobilization, Spinal mobilization, Scar mobilization, Cryotherapy, and Moist heat  PLAN FOR NEXT SESSION: Review HEP and goals, prog LE mobility and strength, balance interventions, manual as appropriate   3:48 PM, 01/22/24 Virginia Curl Powell-Butler, PT, DPT [facility] [place]

## 2024-01-16 NOTE — Discharge Instructions (Signed)
 INSTRUCTIONS AFTER JOINT REPLACEMENT   Remove items at home which could result in a fall. This includes throw rugs or furniture in walking pathways ICE to the affected joint every three hours while awake for 30 minutes at a time, for at least the first 3-5 days, and then as needed for pain and swelling.  Continue to use ice for pain and swelling. You may notice swelling that will progress down to the foot and ankle.  This is normal after surgery.  Elevate your leg when you are not up walking on it.   Continue to use the breathing machine you got in the hospital (incentive spirometer) which will help keep your temperature down.  It is common for your temperature to cycle up and down following surgery, especially at night when you are not up moving around and exerting yourself.  The breathing machine keeps your lungs expanded and your temperature down.   DIET:  As you were doing prior to hospitalization, we recommend a well-balanced diet.  DRESSING / WOUND CARE / SHOWERING  You may shower 3 days after surgery, but keep the wounds dry during showering.  You may use an occlusive plastic wrap (Press'n Seal for example), NO SOAKING/SUBMERGING IN THE BATHTUB.  If the bandage gets wet, change with a clean dry gauze.  If the incision gets wet, pat the wound dry with a clean towel. You have a large ace wrap around your operative leg. Please remove this 24 hours after surgery. Once this is removed, please use your compression hose on both legs during the day, you may remove them at night. Please use these daily until your come in for your follow-up visit.   ACTIVITY  Increase activity slowly as tolerated, but follow the weight bearing instructions below.   No driving for 6 weeks or until further direction given by your physician.  You cannot drive while taking narcotics.  No lifting or carrying greater than 10 lbs. until further directed by your surgeon. Avoid periods of inactivity such as sitting longer than  an hour when not asleep. This helps prevent blood clots.  You may return to work once you are authorized by your doctor.     WEIGHT BEARING   Weight bearing as tolerated with assist device (walker, cane, etc) as directed, use it as long as suggested by your surgeon or therapist, typically at least 4-6 weeks.   EXERCISES  Results after joint replacement surgery are often greatly improved when you follow the exercise, range of motion and muscle strengthening exercises prescribed by your doctor. Safety measures are also important to protect the joint from further injury. Any time any of these exercises cause you to have increased pain or swelling, decrease what you are doing until you are comfortable again and then slowly increase them. If you have problems or questions, call your caregiver or physical therapist for advice.   Rehabilitation is important following a joint replacement. After just a few days of immobilization, the muscles of the leg can become weakened and shrink (atrophy).  These exercises are designed to build up the tone and strength of the thigh and leg muscles and to improve motion. Often times heat used for twenty to thirty minutes before working out will loosen up your tissues and help with improving the range of motion but do not use heat for the first two weeks following surgery (sometimes heat can increase post-operative swelling).   These exercises can be done on a training (exercise) mat, on the  floor, on a table or on a bed. Use whatever works the best and is most comfortable for you.    Use music or television while you are exercising so that the exercises are a pleasant break in your day. This will make your life better with the exercises acting as a break in your routine that you can look forward to.   Perform all exercises about fifteen times, three times per day or as directed.  You should exercise both the operative leg and the other leg as well.  Exercises include:    Quad Sets - Tighten up the muscle on the front of the thigh (Quad) and hold for 5-10 seconds.   Straight Leg Raises - With your knee straight (if you were given a brace, keep it on), lift the leg to 60 degrees, hold for 3 seconds, and slowly lower the leg.  Perform this exercise against resistance later as your leg gets stronger.  Leg Slides: Lying on your back, slowly slide your foot toward your buttocks, bending your knee up off the floor (only go as far as is comfortable). Then slowly slide your foot back down until your leg is flat on the floor again.  Angel Wings: Lying on your back spread your legs to the side as far apart as you can without causing discomfort.  Hamstring Strength:  Lying on your back, push your heel against the floor with your leg straight by tightening up the muscles of your buttocks.  Repeat, but this time bend your knee to a comfortable angle, and push your heel against the floor.  You may put a pillow under the heel to make it more comfortable if necessary.   A rehabilitation program following joint replacement surgery can speed recovery and prevent re-injury in the future due to weakened muscles. Contact your doctor or a physical therapist for more information on knee rehabilitation.    CONSTIPATION  Constipation is defined medically as fewer than three stools per week and severe constipation as less than one stool per week.  Even if you have a regular bowel pattern at home, your normal regimen is likely to be disrupted due to multiple reasons following surgery.  Combination of anesthesia, postoperative narcotics, change in appetite and fluid intake all can affect your bowels.   YOU MUST use at least one of the following options; they are listed in order of increasing strength to get the job done.  They are all available over the counter, and you may need to use some, POSSIBLY even all of these options:    Drink plenty of fluids (prune juice may be helpful) and high  fiber foods Colace 100 mg by mouth twice a day  Senokot for constipation as directed and as needed Dulcolax (bisacodyl), take with full glass of water   Miralax (polyethylene glycol) once or twice a day as needed.  If you have tried all these things and are unable to have a bowel movement in the first 3-4 days after surgery call either your surgeon or your primary doctor.    If you experience loose stools or diarrhea, hold the medications until you stool forms back up.  If your symptoms do not get better within 1 week or if they get worse, check with your doctor.  If you experience the worst abdominal pain ever or develop nausea or vomiting, please contact the office immediately for further recommendations for treatment.   ITCHING:  If you experience itching with your medications, try taking only  a single pain pill, or even half a pain pill at a time.  You can also use Benadryl over the counter for itching or also to help with sleep.   TED HOSE STOCKINGS:  Use stockings on both legs until for at least 2 weeks or as directed by physician office. They may be removed at night for sleeping.  MEDICATIONS:  See your medication summary on the "After Visit Summary" that nursing will review with you.  You may have some home medications which will be placed on hold until you complete the course of blood thinner medication.  It is important for you to complete the blood thinner medication as prescribed.  PRECAUTIONS:  If you experience chest pain or shortness of breath - call 911 immediately for transfer to the hospital emergency department.   If you develop a fever greater that 101 F, purulent drainage from wound, increased redness or drainage from wound, foul odor from the wound/dressing, or calf pain - CONTACT YOUR SURGEON.                                                   FOLLOW-UP APPOINTMENTS:  If you do not already have a post-op appointment, please call the office for an appointment to be seen by  your surgeon.  Guidelines for how soon to be seen are listed in your "After Visit Summary", but are typically between 1-4 weeks after surgery.  OTHER INSTRUCTIONS:   Knee Replacement:  Do not place pillow under knee, focus on keeping the knee straight while resting.   POST-OPERATIVE OPIOID TAPER INSTRUCTIONS: It is important to wean off of your opioid medication as soon as possible. If you do not need pain medication after your surgery it is ok to stop day one. Opioids include: Codeine, Hydrocodone (Norco, Vicodin), Oxycodone (Percocet, oxycontin ) and hydromorphone amongst others.  Long term and even short term use of opiods can cause: Increased pain response Dependence Constipation Depression Respiratory depression And more.  Withdrawal symptoms can include Flu like symptoms Nausea, vomiting And more Techniques to manage these symptoms Hydrate well Eat regular healthy meals Stay active Use relaxation techniques(deep breathing, meditating, yoga) Do Not substitute Alcohol to help with tapering If you have been on opioids for less than two weeks and do not have pain than it is ok to stop all together.  Plan to wean off of opioids This plan should start within one week post op of your joint replacement. Maintain the same interval or time between taking each dose and first decrease the dose.  Cut the total daily intake of opioids by one tablet each day Next start to increase the time between doses. The last dose that should be eliminated is the evening dose.   MAKE SURE YOU:  Understand these instructions.  Get help right away if you are not doing well or get worse.    Thank you for letting us  be a part of your medical care team.  It is a privilege we respect greatly.  We hope these instructions will help you stay on track for a fast and full recovery!

## 2024-01-16 NOTE — Anesthesia Procedure Notes (Signed)
 Spinal  Patient location during procedure: OR Start time: 01/16/2024 10:05 AM End time: 01/16/2024 10:07 AM Reason for block: surgical anesthesia Staffing Performed: anesthesiologist  Anesthesiologist: Maryclare Cornet, MD Performed by: Maryclare Cornet, MD Authorized by: Maryclare Cornet, MD   Preanesthetic Checklist Completed: patient identified, IV checked, risks and benefits discussed, surgical consent, monitors and equipment checked, pre-op evaluation and timeout performed Spinal Block Patient position: sitting Prep: DuraPrep Patient monitoring: cardiac monitor, continuous pulse ox and blood pressure Approach: midline Location: L3-4 Injection technique: single-shot Needle Needle type: Pencan  Needle gauge: 24 G Needle length: 9 cm Assessment Sensory level: T10 Events: CSF return Additional Notes Functioning IV was confirmed and monitors were applied. Sterile prep and drape, including hand hygiene and sterile gloves were used. The patient was positioned and the spine was prepped. The skin was anesthetized with lidocaine .  Free flow of clear CSF was obtained prior to injecting local anesthetic into the CSF.  The spinal needle aspirated freely following injection.  The needle was carefully withdrawn.  The patient tolerated the procedure well.

## 2024-01-16 NOTE — Op Note (Signed)
 01/16/2024  11:52 AM  PATIENT:  Alex Taylor    PRE-OPERATIVE DIAGNOSIS: Right knee primary localized osteoarthritis  POST-OPERATIVE DIAGNOSIS:  Same  PROCEDURE: RIGHT ARTHROPLASTY, KNEE, TOTAL  SURGEON:  [name], MD  PHYSICIAN ASSISTANT: Army Daring, PA-C, present and scrubbed throughout the case, critical for completion in a timely fashion, and for retraction, instrumentation, and closure.  ANESTHESIA:   Spinal  PREOPERATIVE INDICATIONS:  Alex Taylor is a  63 y.o. male with a diagnosis of right knee OA who failed conservative measures and elected for surgical management.  He actually had worse radiographic disease on the left knee than the right knee, but his right knee pain was severe and he had bone marrow edema on the medial and patellofemoral joint seen on MRI.  We discussed the possibility of injections, observation, more time, versus arthroscopic treatment, versus partial knee replacement versus total knee replacement, and the patient wished for total knee replacement.  The risks benefits and alternatives were discussed with the patient preoperatively including but not limited to the risks of infection, bleeding, nerve injury, cardiopulmonary complications, the need for revision surgery, among others, and the patient was willing to proceed.  ESTIMATED BLOOD LOSS: 100 mL  OPERATIVE IMPLANTS:   Implant Name Type Inv. Item Serial No. Manufacturer Lot No. LRB No. Used Action  CEMENT HV SMART SET - [id number] Cement CEMENT HV SMART SET  [employer] [id number] Right 1 Implanted  ATTUNE PSFEM [id number] NARCEM KNEE - [id number] Femur ATTUNE PSFEM [id number] NARCEM KNEE  [employer] [id number] Right 1 Implanted  PATELLA MEDIAL ATTUN KNEE - [id number] Knees PATELLA MEDIAL ATTUN KNEE  [employer]PU[employer]TH[employer] [id number] Right 1 Implanted  BASEPLATE TIB CMT FB PCKT SZ5 - [id number] Knees BASEPLATE TIB CMT FB PCKT SZ5  [employer] [id number] Right 1 Implanted  INSERT  TIB PS FB ATTUNE SZ6X5 - [id number] Knees INSERT TIB PS FB ATTUNE [id number]  [employer] [id number] Right 1 Implanted    OPERATIVE FINDINGS: He had extensive grade 2 and grade 3 changes in the femoral trochlea, the patella was fairly well-preserved, he had some slight osteophyte formation on the medial femoral condyle.  The medial tibia had some extensive grade 2 and grade 3 changes.  The ACL and PCL were intact.  The bone quality was extremely strong.   OPERATIVE DESCRIPTION:  The patient was brought to the operative room and placed in a supine position.  Anesthesia was administered.  IV antibiotics were given.  The lower extremity was prepped and draped in the usual sterile fashion.  Time out was performed.  The leg was elevated and exsanguinated and the tourniquet was inflated.  Anterior quadriceps tendon splitting approach was performed.  The patella was everted and osteophytes were removed.  The anterior horn of the medial and lateral meniscus was removed.   The patella was then measured, and cut with the saw.  The thickness before the cut was 23 and after the cut was 13.5.  A metal shield was used to protect the patella throughout the case.    The distal femur was opened with the drill and the intramedullary distal femoral cutting jig was utilized, set at 5 degrees resecting 9 mm off the distal femur.  Care was taken to protect the collateral ligaments.  Then the extramedullary tibial cutting jig was utilized making the appropriate cut using the anterior tibial crest as a reference building in appropriate posterior slope.  Care was taken during the cut to protect the medial  and collateral ligaments.  The proximal tibia was removed along with the posterior horns of the menisci.  The PCL was sacrificed.    The extensor gap was measured and found to have adequate resection, measuring to a size 10.    The distal femoral sizing jig was applied, taking care to avoid notching.  This was set at 3  degrees of external rotation.  Then the 4-in-1 cutting jig was applied and the anterior and posterior femur was cut, along with the chamfer cuts.  All posterior osteophytes were removed.  The flexion gap was then measured and was symmetric with the extension gap.  I completed the distal femoral preparation using the appropriate jig to prepare the box.  The proximal tibia sized and prepared accordingly with the reamer and the punch, and then all components were trialed with the poly insert.  The knee was found to have excellent balance and full motion.    The above named components were then cemented into place and all excess cement was removed.  The real polyethylene implant was placed.  After the cement had cured I released the tourniquet and confirmed excellent hemostasis with no major posterior vessel injury.    The knee was easily taken through a range of motion and the patella tracked well and the knee irrigated copiously and the parapatellar tissue closed with Stratafix and vicryl, and subcutaneous tissue closed with vicryl, and monocryl with steri strips for the skin.  The wounds were injected with marcaine , and dressed with sterile gauze and the patient was awakened and returned to the PACU in stable and satisfactory condition.  There were no complications.  Total tourniquet time was ~80 minutes at hg.

## 2024-01-16 NOTE — Interval H&P Note (Signed)
 History and Physical Interval Note:  01/16/2024 9:09 AM  Alex Taylor  has presented today for surgery, with the diagnosis of right knee OA.  The various methods of treatment have been discussed with the patient and family. After consideration of risks, benefits and other options for treatment, the patient has consented to  Procedure(s): ARTHROPLASTY, KNEE, TOTAL (Right) as a surgical intervention.  The patient's history has been reviewed, patient examined, no change in status, stable for surgery.  I have reviewed the patient's chart and labs.  Questions were answered to the patient's satisfaction.     [name]

## 2024-01-16 NOTE — Anesthesia Procedure Notes (Signed)
 Procedure Name: MAC Date/Time: 01/16/2024 10:02 AM  Performed by: Nada Corean CROME, CRNAPre-anesthesia Checklist: Patient identified, Emergency Drugs available, Suction available, Patient being monitored and Timeout performed Patient Re-evaluated:Patient Re-evaluated prior to induction Oxygen Delivery Method: Simple face mask Preoxygenation: Pre-oxygenation with 100% oxygen Induction Type: IV induction Airway Equipment and Method: Oral airway Placement Confirmation: positive ETCO2 Dental Injury: Teeth and Oropharynx as per pre-operative assessment

## 2024-01-17 ENCOUNTER — Encounter (HOSPITAL_COMMUNITY): Payer: Self-pay | Admitting: Orthopedic Surgery

## 2024-01-22 ENCOUNTER — Other Ambulatory Visit (HOSPITAL_COMMUNITY): Payer: Self-pay

## 2024-01-22 ENCOUNTER — Encounter (HOSPITAL_COMMUNITY): Payer: Self-pay | Admitting: Orthopedic Surgery

## 2024-01-22 ENCOUNTER — Ambulatory Visit (HOSPITAL_COMMUNITY)

## 2024-01-22 ENCOUNTER — Other Ambulatory Visit: Payer: Self-pay

## 2024-01-22 DIAGNOSIS — Z7409 Other reduced mobility: Secondary | ICD-10-CM | POA: Insufficient documentation

## 2024-01-22 DIAGNOSIS — R29898 Other symptoms and signs involving the musculoskeletal system: Secondary | ICD-10-CM | POA: Insufficient documentation

## 2024-01-22 DIAGNOSIS — M25661 Stiffness of right knee, not elsewhere classified: Secondary | ICD-10-CM | POA: Insufficient documentation

## 2024-01-22 DIAGNOSIS — M25561 Pain in right knee: Secondary | ICD-10-CM | POA: Diagnosis present

## 2024-01-22 NOTE — Anesthesia Postprocedure Evaluation (Signed)
 Anesthesia Post Note  Patient: Koren LELON Saba  Procedure(s) Performed: ARTHROPLASTY, KNEE, TOTAL (Right: Knee)     Patient location during evaluation: PACU Anesthesia Type: MAC, Spinal and Regional Level of consciousness: oriented and awake and alert Pain management: pain level controlled Vital Signs Assessment: post-procedure vital signs reviewed and stable Respiratory status: spontaneous breathing, respiratory function stable and patient connected to nasal cannula oxygen Cardiovascular status: blood pressure returned to baseline and stable Postop Assessment: no headache, no backache and no apparent nausea or vomiting Anesthetic complications: no   No notable events documented.  Last Vitals:  Vitals:   01/16/24 1759 01/16/24 1800  BP: (!) 155/88 (!) 155/88  Pulse: 75   Resp:    Temp:    SpO2: 98%     Last Pain:  Vitals:   01/16/24 1804  PainSc: 2                  Aldina Porta S

## 2024-01-23 ENCOUNTER — Other Ambulatory Visit (HOSPITAL_COMMUNITY): Payer: Self-pay

## 2024-01-24 ENCOUNTER — Other Ambulatory Visit (HOSPITAL_COMMUNITY): Payer: Self-pay

## 2024-01-25 ENCOUNTER — Ambulatory Visit (HOSPITAL_COMMUNITY)

## 2024-01-25 ENCOUNTER — Encounter (HOSPITAL_COMMUNITY): Payer: Self-pay

## 2024-01-25 DIAGNOSIS — M25561 Pain in right knee: Secondary | ICD-10-CM

## 2024-01-25 DIAGNOSIS — M25661 Stiffness of right knee, not elsewhere classified: Secondary | ICD-10-CM | POA: Diagnosis not present

## 2024-01-25 DIAGNOSIS — R29898 Other symptoms and signs involving the musculoskeletal system: Secondary | ICD-10-CM

## 2024-01-25 DIAGNOSIS — Z7409 Other reduced mobility: Secondary | ICD-10-CM

## 2024-01-25 NOTE — Therapy (Signed)
 OUTPATIENT PHYSICAL THERAPY LOWER EXTREMITY TREATMENT   Patient Name: JAYSUN WESSELS MRN: [id number] DOB:04-15-1960, 63 y.o., male Today's Date: 01/25/2024  END OF SESSION:  PT End of Session - 01/25/24 1424     Visit Number 2    Number of Visits 16    Date for Recertification  02/19/24    Authorization Type Generic [employer]    Authorization Time Period No auth    Progress Note Due on Visit 10    PT Start Time 1418    PT Stop Time 1500    PT Time Calculation (min) 42 min    Activity Tolerance Patient tolerated treatment well    Behavior During Therapy WFL for tasks assessed/performed           Past Medical History:  Diagnosis Date   Arthritis    Diabetes mellitus without complication (HCC)    History of kidney stones    Hypertension    Past Surgical History:  Procedure Laterality Date   APPENDECTOMY     BACK SURGERY     COLONOSCOPY  12/20/2011   Procedure: COLONOSCOPY;  Surgeon: Oneil DELENA Budge, MD;  Location: [facility];  Service: Gastroenterology;  Laterality: N/A;   FINGER SURGERY     KNEE ARTHROSCOPY     left knee   TOTAL KNEE ARTHROPLASTY Right 01/16/2024   Procedure: ARTHROPLASTY, KNEE, TOTAL;  Surgeon: Josefina Chew, MD;  Location: [facility];  Service: Orthopedics;  Laterality: Right;   Patient Active Problem List   Diagnosis Date Noted   DM (diabetes mellitus) (HCC) 03/21/2012   Difficulty walking 10/28/2011   Open wound of hand except fingers 05/29/2008   Open wound of finger with tendon involvement 05/29/2008    PCP: Dow Longs, PA-C  REFERRING PROVIDER: Josefina Chew, MD  REFERRING DIAG:  [phone] (ICD-10-CM) - Presence of right artificial knee joint    THERAPY DIAG:  Decreased range of motion (ROM) of right knee  Impaired functional mobility, balance, gait, and endurance  Acute pain of right knee  Decreased strength of lower extremity  Rationale for Evaluation and Treatment: Rehabilitation  ONSET DATE: 01/16/24  SUBJECTIVE:    SUBJECTIVE STATEMENT: Patient reports that he tweaked his knee while getting out of the car. He has been doing his HEP at home.     Eval: Patient reports having surgery on 11/25. Reports he's tried some exercises at home that they gave him from hospital but pain and swelling restricting him some. Didn't get home therapy, and no CPM machine.    PERTINENT HISTORY: PEDER ALLUMS is a  63 y.o. male with a diagnosis of right knee OA who failed conservative measures and elected for surgical management.  He actually had worse radiographic disease on the left knee than the right knee, but his right knee pain was severe and he had bone marrow edema on the medial and patellofemoral joint seen on MRI.  We discussed the possibility of injections, observation, more time, versus arthroscopic treatment, versus partial knee replacement versus total knee replacement, and the patient wished for total knee replacement. PAIN:  Are you having pain? Yes: NPRS scale: 6-7/10 Pain location: Entire R knee Pain description: Achy Aggravating factors: Walking, movement Relieving factors: Rest, medications some, Ice  PRECAUTIONS: None  RED FLAGS: None   WEIGHT BEARING RESTRICTIONS: No  FALLS:  Has patient fallen in last 6 months? 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  DIAGNOSTIC FINDINGS:  IMPRESSION: 1. Right total knee prosthesis in place without visible periprosthetic fracture or acute bony findings. 2. Expected gas  in the joint and soft tissues. 3. No complicating feature on this 2 view series.  PATIENT SURVEYS:  LEFS  Extreme difficulty/unable (0), Quite a bit of difficulty (1), Moderate difficulty (2), Little difficulty (3), No difficulty (4) Survey date:    Any of your usual work, housework or school activities   2. Usual hobbies, recreational or sporting activities   3. Getting into/out of the bath   4. Walking between rooms   5. Putting on socks/shoes   6. Squatting    7. Lifting an object, like a bag of groceries from the floor   8. Performing light activities around your home   9. Performing heavy activities around your home   10. Getting into/out of a car   11. Walking 2 blocks   12. Walking 1 mile   13. Going up/down 10 stairs (1 flight)   14. Standing for 1 hour   15.  sitting for 1 hour   16. Running on even ground   17. Running on uneven ground   18. Making sharp turns while running fast   19. Hopping    20. Rolling over in bed   Score total:  Lower Extremity Functional Score: 5 / 80 = 6.3 %      COGNITION: Overall cognitive status: Within functional limits for tasks assessed     SENSATION: Light touch: WFL  EDEMA:  Mild-moderate edema in R knee joint and R gastroc muscle R  PALPATION: TTP on medial and lateral poles of R knee joint   LOWER EXTREMITY ROM:  Active ROM Right eval Left eval  Hip flexion    Hip extension    Hip abduction    Hip adduction    Hip internal rotation    Hip external rotation    Knee flexion 66 WFL  Knee extension -10 0  Ankle dorsiflexion    Ankle plantarflexion    Ankle inversion    Ankle eversion     (Blank rows = not tested)  LOWER EXTREMITY MMT:  MMT Right eval Left eval  Hip flexion    Hip extension    Hip abduction    Hip adduction    Hip internal rotation    Hip external rotation    Knee flexion    Knee extension    Ankle dorsiflexion    Ankle plantarflexion    Ankle inversion    Ankle eversion     (Blank  rows = not tested)  LOWER EXTREMITY SPECIAL TESTS:    FUNCTIONAL TESTS:  5 times sit to stand: 42 seconds Timed up and go (TUG):  54 seconds w/ RW 2 minute walk test: 143 feet with RW on 01/25/24  GAIT: Distance walked: at least 100 ft in session Assistive device utilized: Walker - 2 wheeled Level of assistance: Modified independence Comments: Ambulates with RW, dec weight shift/stance time w/ RLE  -nearly TTWB, dec velocity  TREATMENT DATE:                                    12/05/24/23 EXERCISE LOG  Exercise Repetitions and Resistance Comments  Nustep  L1 x 5 minutes @ seat 9   2 minute walk test   143 feet  With RW  Standing gastroc stretch  3 x 30 seconds    Lunges onto step  2.5 minutes    Standing hamstring stretch   3 x 30 seconds    Squatting  10 reps    Patient education  See below     Blank cell = exercise not performed today   01/22/24: PT Eval and HEP    PATIENT EDUCATION:  Education details: goals for physical therapy, healing, x-ray results, anatomy, and expectations for soreness Person educated: Patient Education method: Explanation Education comprehension: verbalized understanding  HOME EXERCISE PROGRAM: Access Code: [email or web address] URL: [email or web address] Date: 01/22/2024 Prepared by: Rosaria Powell-Butler  Exercises - Supine Ankle Pumps  - 3 x daily - 7 x weekly - 3 sets - 10 reps - Quad Setting and Stretching  - 10 x daily - 7 x weekly - 3 sets - 10 reps - 5-10 hold - Supine Heel Slide with Strap  - 2 x daily - 7 x weekly - 3 sets - 10 reps - 5-10 hold - Sit to Stand Without Arm Support  - 2 x daily - 7 x weekly - 3 sets - 10 reps - Seated Heel Slide  - 2 x daily - 7 x weekly - 3 sets - 10 reps - 5-10 hold  ASSESSMENT:  CLINICAL IMPRESSION: Patient was progressed with multiple new interventions for improved knee  mobility.  He required minimal cueing with today's new interventions for proper exercise performance. He experienced a moderate increase in fatigue and soreness with today's interventions. However, this did not limit his ability to complete any of today's interventions. He reported feeling alright upon the conclusion of treatment. Patient continues to require skilled physical therapy to address her remaining impairments to return to her prior level of function.     Eval: Patient is a 63 y.o. male who was seen today for physical therapy evaluation and treatment for  Z96.651 (ICD-10-CM) - Presence of right artificial knee joint  . On this date, patient demonstrates Impaired self perception of function, decreased ROM in R knee, decreased strength in RLE, impaired balance, and increased pain all of which may be contributing to patient's altered gait, difficulty with functional transfers, increased fall risk, difficulty with completing ADLs, and decreased endurance/activity tolerance. Educated on signs of DVT, appropriate resting positions for knee (can prop knee up as long as ankle is higher than knee to prevent risk of contractures), and importance of compression, and elevation. PT and wife report understanding of all. Patient will benefit from continued skilled physical therapy in order to address the above deficits and improve overall function.   OBJECTIVE IMPAIRMENTS: Abnormal gait, decreased activity tolerance, decreased balance, decreased endurance, decreased mobility, difficulty walking, decreased ROM, decreased strength, increased edema, impaired perceived functional ability, impaired flexibility, improper body mechanics, postural dysfunction, and pain.   ACTIVITY LIMITATIONS: carrying, lifting, bending, sitting, standing, squatting, stairs, transfers, bed mobility, bathing, and dressing  PARTICIPATION LIMITATIONS: meal prep, cleaning, laundry, driving, community activity, occupation, and yard  work  PERSONAL FACTORS: N/A are also affecting patient's functional outcome.   REHAB POTENTIAL: Good  CLINICAL  DECISION MAKING: Stable/uncomplicated  EVALUATION COMPLEXITY: Low   GOALS: Goals reviewed with patient? No  SHORT TERM GOALS: Target date: 02/23/23 Patient will be independent with performance of HEP to demonstrate adequate self management of symptoms.  Baseline:  Goal status: INITIAL  2.   Patient will report at least a 25% improvement with function and/or pain reduction overall since beginning PT. Baseline:  Goal status: INITIAL   LONG TERM GOALS: Target date: 03/23/23 Patient will improve LEFS score by 9 points to demonstrate improved perceived function while meeting MCID.  Baseline: Goal status: INITIAL 2.  Patient will improve  R knee flexion  ROM to at least 120 degrees to demonstrate improved LE mobility needed for functional transfers and gait mechanics.  Baseline:  Goal status: INITIAL 3.  Patient will improve  R knee extension  ROM to at least 0 degrees to demonstrate improved LE mobility needed for functional transfers and gait mechanics.  Baseline:  Goal status: INITIAL 4.  Patient will improve 5 times sit to stand test by at least 12 seconds to demonstrate increased LE strength and/or power needed to improve functional transfers.  Baseline:  Goal status: INITIAL   5.  Patient will improve TUG score to 12 seconds or less with least restrictive assistive device to demonstrate improved fall risk.  Baseline: Goal status: INITIAL    PLAN:  PT FREQUENCY: 2x/week  PT DURATION: 8 weeks  PLANNED INTERVENTIONS: [id number]- PT Re-evaluation, 97110-Therapeutic exercises, 97530- Therapeutic activity, [id number]- Neuromuscular re-education, 97535- Self Care, [id number]- Manual therapy, [id number]- Gait training, [phone]- Electrical stimulation (manual), [id number]- Ultrasound, [id number]- Traction (mechanical), [phone] (1-2 muscles), [id number] (3+ muscles)- Dry Needling, Patient/Family education,  Balance training, Stair training, Taping, Joint mobilization, Spinal mobilization, Scar mobilization, Cryotherapy, and Moist heat  PLAN FOR NEXT SESSION: Review HEP and goals, prog LE mobility and strength, balance interventions, manual as appropriate  Lacinda Fass, PT, DPT  3:11 PM, 01/25/24

## 2024-01-29 ENCOUNTER — Ambulatory Visit (HOSPITAL_COMMUNITY)

## 2024-02-01 ENCOUNTER — Ambulatory Visit (HOSPITAL_COMMUNITY)

## 2024-02-01 ENCOUNTER — Encounter (HOSPITAL_COMMUNITY): Payer: Self-pay

## 2024-02-01 DIAGNOSIS — M25661 Stiffness of right knee, not elsewhere classified: Secondary | ICD-10-CM

## 2024-02-01 DIAGNOSIS — M25561 Pain in right knee: Secondary | ICD-10-CM

## 2024-02-01 DIAGNOSIS — R29898 Other symptoms and signs involving the musculoskeletal system: Secondary | ICD-10-CM

## 2024-02-01 DIAGNOSIS — Z7409 Other reduced mobility: Secondary | ICD-10-CM

## 2024-02-01 NOTE — Therapy (Signed)
 OUTPATIENT PHYSICAL THERAPY LOWER EXTREMITY TREATMENT   Patient Name: MACAULAY REICHER MRN: [id number] DOB:05/31/1960, 63 y.o., male Today's Date: 02/01/2024  END OF SESSION:  PT End of Session - 02/01/24 1414     Visit Number 3    Number of Visits 16    Date for Recertification  02/19/24    Authorization Type Generic [employer]    Authorization Time Period No auth    Progress Note Due on Visit 10    PT Start Time 1415    PT Stop Time 1459    PT Time Calculation (min) 44 min    Activity Tolerance Patient tolerated treatment well    Behavior During Therapy WFL for tasks assessed/performed           Past Medical History:  Diagnosis Date   Arthritis    Diabetes mellitus without complication (HCC)    History of kidney stones    Hypertension    Past Surgical History:  Procedure Laterality Date   APPENDECTOMY     BACK SURGERY     COLONOSCOPY  12/20/2011   Procedure: COLONOSCOPY;  Surgeon: Oneil DELENA Budge, MD;  Location: [facility];  Service: Gastroenterology;  Laterality: N/A;   FINGER SURGERY     KNEE ARTHROSCOPY     left knee   TOTAL KNEE ARTHROPLASTY Right 01/16/2024   Procedure: ARTHROPLASTY, KNEE, TOTAL;  Surgeon: Josefina Chew, MD;  Location: [facility];  Service: Orthopedics;  Laterality: Right;   Patient Active Problem List   Diagnosis Date Noted   DM (diabetes mellitus) (HCC) 03/21/2012   Difficulty walking 10/28/2011   Open wound of hand except fingers 05/29/2008   Open wound of finger with tendon involvement 05/29/2008    PCP: Dow Longs, PA-C  REFERRING PROVIDER: Josefina Chew, MD  REFERRING DIAG:  [phone] (ICD-10-CM) - Presence of right artificial knee joint    THERAPY DIAG:  Decreased range of motion (ROM) of right knee  Impaired functional mobility, balance, gait, and endurance  Acute pain of right knee  Decreased strength of lower extremity  Rationale for Evaluation and Treatment: Rehabilitation  ONSET DATE: 01/16/24  SUBJECTIVE:    SUBJECTIVE STATEMENT: Patient reports pain today is around 6-7/10. Reports it hurts a lot when he gets into and out of car. Reports he hasn't taken pain medication today. Only has two pills left.  Follow up visit tomorrow with MD. Reports he's doing HEP daily.    Eval: Patient reports having surgery on 11/25. Reports he's tried some exercises at home that they gave him from hospital but pain and swelling restricting him some. Didn't get home therapy, and no CPM machine.    PERTINENT HISTORY: ABASS MISENER is a  63 y.o. male with a diagnosis of right knee OA who failed conservative measures and elected for surgical management.  He actually had worse radiographic disease on the left knee than the right knee, but his right knee pain was severe and he had bone marrow edema on the medial and patellofemoral joint seen on MRI.  We discussed the possibility of injections, observation, more time, versus arthroscopic treatment, versus partial knee replacement versus total knee replacement, and the patient wished for total knee replacement. PAIN:  Are you having pain? Yes: NPRS scale: 6-7/10 Pain location: Entire R knee Pain description: Achy Aggravating factors: Walking, movement Relieving factors: Rest, medications some, Ice  PRECAUTIONS: None  RED FLAGS: None   WEIGHT BEARING RESTRICTIONS: No  FALLS:  Has patient fallen in last 6 months?  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  DIAGNOSTIC FINDINGS:   IMPRESSION: 1. Right total knee prosthesis in place without visible periprosthetic fracture or acute bony findings. 2. Expected gas in the joint and soft tissues. 3. No complicating feature on this 2 view series.  PATIENT SURVEYS:  LEFS  Extreme difficulty/unable (0), Quite a bit of difficulty (1), Moderate difficulty (2), Little difficulty (3), No difficulty (4) Survey date:    Any of your usual work, housework or school activities   2. Usual hobbies, recreational or sporting activities   3. Getting into/out of the bath   4. Walking between rooms   5. Putting on socks/shoes   6. Squatting    7. Lifting an object, like a bag of groceries from the floor   8. Performing light activities around your home   9. Performing heavy activities around your home   10. Getting into/out of a car   11. Walking 2 blocks   12. Walking 1 mile   13. Going up/down 10 stairs (1 flight)   14. Standing for 1 hour   15.  sitting for 1 hour   16. Running on even ground   17. Running on uneven ground   18. Making sharp turns while running fast   19. Hopping    20. Rolling over in bed   Score total:  Lower Extremity Functional Score: 5 / 80 = 6.3 %      COGNITION: Overall cognitive status: Within functional limits for tasks assessed     SENSATION: Light touch: WFL  EDEMA:  Mild-moderate edema in R knee joint and R gastroc muscle R  PALPATION: TTP on medial and lateral poles of R knee joint   LOWER EXTREMITY ROM:  Active ROM Right eval Left eval  Hip flexion    Hip extension    Hip abduction    Hip adduction    Hip internal rotation    Hip external rotation    Knee flexion 66 WFL  Knee extension -10 0  Ankle dorsiflexion    Ankle plantarflexion    Ankle inversion    Ankle eversion     (Blank rows = not tested)  LOWER EXTREMITY MMT:  MMT Right eval Left eval  Hip flexion    Hip extension    Hip abduction    Hip adduction    Hip internal rotation    Hip external  rotation    Knee flexion    Knee extension    Ankle dorsiflexion    Ankle plantarflexion    Ankle inversion    Ankle eversion     (Blank rows = not tested)  LOWER EXTREMITY SPECIAL TESTS:    FUNCTIONAL TESTS:  5 times sit to stand: 42 seconds Timed up and go (TUG):  54 seconds w/ RW 2 minute walk test: 143 feet with RW on 01/25/24  GAIT: Distance walked: at least 100 ft in session Assistive device utilized: Walker - 2 wheeled Level of assistance: Modified independence Comments: Ambulates with  RW, dec weight shift/stance time w/ RLE  -nearly TTWB, dec velocity                                                                                                                                TREATMENT DATE:  02/01/24: NuStep, seat 9, level 1, 5 minutes  STM to R knee musculature, 8' Supine quad set, squeeze ball under knee, 5 holds, 10x SAQ, 10x, half foam roll under knee, v cues for form Supine heel slides w/ strap, 5 holds, 10x AROM measurement: ext -8, flexion 80 Seated heel slides, 10x Knee drives at staircase to max flexion and extension, 10x STS from chair level, 10x, emphasis on equal weight between feet                                     12/05/24/23 EXERCISE LOG  Exercise Repetitions and Resistance Comments  Nustep  L1 x 5 minutes @ seat 9   2 minute walk test   143 feet  With RW  Standing gastroc stretch  3 x 30 seconds    Lunges onto step  2.5 minutes    Standing hamstring stretch   3 x 30 seconds    Squatting  10 reps    Patient education  See below     Blank cell = exercise not performed today   01/22/24: PT Eval and HEP    PATIENT EDUCATION:  Education details: goals for physical therapy, healing, x-ray results, anatomy, and expectations for soreness Person educated: Patient Education method: Explanation Education comprehension: verbalized understanding  HOME EXERCISE PROGRAM: Access Code: [email or web address] URL: [email or web address] Date:  01/22/2024 Prepared by: Rosaria Powell-Butler  Exercises - Supine Ankle Pumps  - 3 x daily - 7 x weekly - 3 sets - 10 reps - Quad Setting and Stretching  - 10 x daily - 7 x weekly - 3 sets - 10 reps - 5-10 hold - Supine Heel Slide with Strap  - 2 x daily - 7 x weekly - 3 sets - 10 reps - 5-10 hold - Sit to Stand Without Arm Support  - 2 x daily - 7 x weekly - 3 sets - 10 reps - Seated Heel Slide  - 2 x daily - 7 x weekly - 3 sets - 10 reps - 5-10 hold  ASSESSMENT:  CLINICAL IMPRESSION: Began session on NuStep for dynamic warm up and mobility for R knee. Patient tolerates well but takes quick rest breaks throughout. Followed with manual massage to R knee and quad just along borders of tape. Pt reports tenderness on medial and lateral aspect of knee. Followed with mobility and strengthening exercises, focused on quads this date. Pt demonstrates inc pain but improvements with quad engagement. AROM measurements this date: -8 from full extension and 80 degrees flexion. Emphasized equal weight shift between feet during STS at end of  session. Patient continues to require skilled physical therapy to address her remaining impairments to return to her prior level of function.       Eval: Patient is a 63 y.o. male who was seen today for physical therapy evaluation and treatment for  Z96.651 (ICD-10-CM) - Presence of right artificial knee joint  . On this date, patient demonstrates Impaired self perception of function, decreased ROM in R knee, decreased strength in RLE, impaired balance, and increased pain all of which may be contributing to patient's altered gait, difficulty with functional transfers, increased fall risk, difficulty with completing ADLs, and decreased endurance/activity tolerance. Educated on signs of DVT, appropriate resting positions for knee (can prop knee up as long as ankle is higher than knee to prevent risk of contractures), and importance of compression, and elevation. PT and wife  report understanding of all. Patient will benefit from continued skilled physical therapy in order to address the above deficits and improve overall function.   OBJECTIVE IMPAIRMENTS: Abnormal gait, decreased activity tolerance, decreased balance, decreased endurance, decreased mobility, difficulty walking, decreased ROM, decreased strength, increased edema, impaired perceived functional ability, impaired flexibility, improper body mechanics, postural dysfunction, and pain.   ACTIVITY LIMITATIONS: carrying, lifting, bending, sitting, standing, squatting, stairs, transfers, bed mobility, bathing, and dressing  PARTICIPATION LIMITATIONS: meal prep, cleaning, laundry, driving, community activity, occupation, and yard work  PERSONAL FACTORS: N/A are also affecting patient's functional outcome.   REHAB POTENTIAL: Good  CLINICAL DECISION MAKING: Stable/uncomplicated  EVALUATION COMPLEXITY: Low   GOALS: Goals reviewed with patient? No  SHORT TERM GOALS: Target date: 02/23/23 Patient will be independent with performance of HEP to demonstrate adequate self management of symptoms.  Baseline:  Goal status: INITIAL  2.   Patient will report at least a 25% improvement with function and/or pain reduction overall since beginning PT. Baseline:  Goal status: INITIAL   LONG TERM GOALS: Target date: 03/23/23 Patient will improve LEFS score by 9 points to demonstrate improved perceived function while meeting MCID.  Baseline: Goal status: INITIAL 2.  Patient will improve  R knee flexion  ROM to at least 120 degrees to demonstrate improved LE mobility needed for functional transfers and gait mechanics.  Baseline:  Goal status: INITIAL 3.  Patient will improve  R knee extension  ROM to at least 0 degrees to demonstrate improved LE mobility needed for functional transfers and gait mechanics.  Baseline:  Goal status: INITIAL 4.  Patient will improve 5 times sit to stand test by at least 12 seconds to  demonstrate increased LE strength and/or power needed to improve functional transfers.  Baseline:  Goal status: INITIAL   5.  Patient will improve TUG score to 12 seconds or less with least restrictive assistive device to demonstrate improved fall risk.  Baseline: Goal status: INITIAL    PLAN:  PT FREQUENCY: 2x/week  PT DURATION: 8 weeks  PLANNED INTERVENTIONS: [id number]- PT Re-evaluation, 97110-Therapeutic exercises, 97530- Therapeutic activity, [id number]- Neuromuscular re-education, 97535- Self Care, [id number]- Manual therapy, [id number]- Gait training, [phone]- Electrical stimulation (manual), [id number]- Ultrasound, [id number]- Traction (mechanical), [phone] (1-2 muscles), [id number] (3+ muscles)- Dry Needling, Patient/Family education, Balance training, Stair training, Taping, Joint mobilization, Spinal mobilization, Scar mobilization, Cryotherapy, and Moist heat  PLAN FOR NEXT SESSION: Review HEP and goals, prog LE mobility and strength, balance interventions, manual as appropriate    3:38 PM, 02/01/2024 Fiora Weill Powell-Butler, PT, DPT [facility] [place]

## 2024-02-02 DIAGNOSIS — M25561 Pain in right knee: Secondary | ICD-10-CM | POA: Diagnosis not present

## 2024-02-02 DIAGNOSIS — Z8614 Personal history of Methicillin resistant Staphylococcus aureus infection: Secondary | ICD-10-CM | POA: Diagnosis not present

## 2024-02-02 DIAGNOSIS — Z96651 Presence of right artificial knee joint: Secondary | ICD-10-CM | POA: Diagnosis not present

## 2024-02-02 DIAGNOSIS — Z79891 Long term (current) use of opiate analgesic: Secondary | ICD-10-CM | POA: Diagnosis not present

## 2024-02-02 DIAGNOSIS — Z471 Aftercare following joint replacement surgery: Secondary | ICD-10-CM | POA: Diagnosis not present

## 2024-02-05 ENCOUNTER — Ambulatory Visit (HOSPITAL_COMMUNITY)

## 2024-02-05 ENCOUNTER — Encounter (HOSPITAL_COMMUNITY): Payer: Self-pay

## 2024-02-05 DIAGNOSIS — Z7409 Other reduced mobility: Secondary | ICD-10-CM

## 2024-02-05 DIAGNOSIS — M25661 Stiffness of right knee, not elsewhere classified: Secondary | ICD-10-CM | POA: Diagnosis not present

## 2024-02-05 DIAGNOSIS — M25561 Pain in right knee: Secondary | ICD-10-CM

## 2024-02-05 NOTE — Therapy (Signed)
 OUTPATIENT PHYSICAL THERAPY LOWER EXTREMITY TREATMENT   Patient Name: Alex Taylor MRN: [id number] DOB:03-30-1960, 63 y.o., male Today's Date: 02/05/2024  END OF SESSION:  PT End of Session - 02/05/24 1423     Visit Number 4    Number of Visits 16    Date for Recertification  02/19/24    Authorization Type Generic [employer]    Authorization Time Period No auth    Progress Note Due on Visit 10    PT Start Time 1420    PT Stop Time 1459    PT Time Calculation (min) 39 min    Activity Tolerance Patient tolerated treatment well    Behavior During Therapy WFL for tasks assessed/performed           Past Medical History:  Diagnosis Date   Arthritis    Diabetes mellitus without complication (HCC)    History of kidney stones    Hypertension    Past Surgical History:  Procedure Laterality Date   APPENDECTOMY     BACK SURGERY     COLONOSCOPY  12/20/2011   Procedure: COLONOSCOPY;  Surgeon: Alex DELENA Budge, MD;  Location: [facility];  Service: Gastroenterology;  Laterality: N/A;   FINGER SURGERY     KNEE ARTHROSCOPY     left knee   TOTAL KNEE ARTHROPLASTY Right 01/16/2024   Procedure: ARTHROPLASTY, KNEE, TOTAL;  Surgeon: Alex Chew, MD;  Location: [facility];  Service: Orthopedics;  Laterality: Right;   Patient Active Problem List   Diagnosis Date Noted   DM (diabetes mellitus) (HCC) 03/21/2012   Difficulty walking 10/28/2011   Open wound of hand except fingers 05/29/2008   Open wound of finger with tendon involvement 05/29/2008    PCP: Alex Longs, PA-C  REFERRING PROVIDER: Josefina Chew, MD  REFERRING DIAG:  [phone] (ICD-10-CM) - Presence of right artificial knee joint    THERAPY DIAG:  Decreased range of motion (ROM) of right knee  Impaired functional mobility, balance, gait, and endurance  Acute pain of right knee  Rationale for Evaluation and Treatment: Rehabilitation  ONSET DATE: 01/16/24  SUBJECTIVE:   SUBJECTIVE STATEMENT: Patient reports pain  today is around 3/10. Reports he had his follow up visit with Ortho and they were pleased. They removed bandages.  Feels he can bend it more now that bandage is off.    Eval: Patient reports having surgery on 11/25. Reports he's tried some exercises at home that they gave him from hospital but pain and swelling restricting him some. Didn't get home therapy, and no CPM machine.    PERTINENT HISTORY: Alex Taylor is a  63 y.o. male with a diagnosis of right knee OA who failed conservative measures and elected for surgical management.  He actually had worse radiographic disease on the left knee than the right knee, but his right knee pain was severe and he had bone marrow edema on the medial and patellofemoral joint seen on MRI.  We discussed the possibility of injections, observation, more time, versus arthroscopic treatment, versus partial knee replacement versus total knee replacement, and the patient wished for total knee replacement. PAIN:  Are you having pain? Yes: NPRS scale: 6-7/10 Pain location: Entire R knee Pain description: Achy Aggravating factors: Walking, movement Relieving factors: Rest, medications some, Ice  PRECAUTIONS: None  RED FLAGS: None   WEIGHT BEARING RESTRICTIONS: No  FALLS:  Has patient fallen in last 6 months? 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  DIAGNOSTIC FINDINGS:  IMPRESSION: 1. Right total knee prosthesis in place without visible periprosthetic fracture or acute  bony findings. 2. Expected gas in the joint and soft tissues. 3. No complicating feature on this 2 view series.  PATIENT SURVEYS:  LEFS  Extreme difficulty/unable (0), Quite a bit of difficulty (1), Moderate difficulty (2), Little difficulty (3), No difficulty (4) Survey date:    Any of your usual work, housework or school activities   2. Usual hobbies, recreational or sporting activities   3. Getting into/out of the bath   4. Walking between rooms   5. Putting on socks/shoes   6. Squatting    7. Lifting an object, like a bag of groceries from the floor   8. Performing light activities around your home   9. Performing heavy activities around your home   10. Getting into/out of a car   11. Walking 2 blocks   12. Walking 1 mile   13. Going up/down 10 stairs (1 flight)   14. Standing for 1 hour   15.  sitting for 1 hour   16. Running on even ground   17. Running on uneven ground   18. Making sharp turns while running fast   19. Hopping    20. Rolling over in bed   Score total:  Lower Extremity Functional Score: 5 / 80 = 6.3 %      COGNITION: Overall cognitive status: Within functional limits for tasks assessed     SENSATION: Light touch: WFL  EDEMA:  Mild-moderate edema in R knee joint and R gastroc muscle R  PALPATION: TTP on medial and lateral poles of R knee joint   LOWER EXTREMITY ROM:  Active ROM Right eval Left eval  Hip flexion    Hip extension    Hip abduction    Hip adduction    Hip internal rotation    Hip external rotation    Knee flexion 66 WFL  Knee extension -10 0  Ankle dorsiflexion    Ankle plantarflexion    Ankle inversion    Ankle eversion     (Blank rows = not tested)  LOWER EXTREMITY MMT:  MMT Right eval Left eval  Hip flexion    Hip extension    Hip abduction    Hip adduction    Hip internal rotation    Hip external rotation    Knee flexion    Knee extension    Ankle dorsiflexion    Ankle plantarflexion    Ankle  inversion    Ankle eversion     (Blank rows = not tested)  LOWER EXTREMITY SPECIAL TESTS:    FUNCTIONAL TESTS:  5 times sit to stand: 42 seconds Timed up and go (TUG):  54 seconds w/ RW 2 minute walk test: 143 feet with RW on 01/25/24  GAIT: Distance walked: at least 100 ft in session Assistive device utilized: Walker - 2 wheeled Level of assistance: Modified independence Comments: Ambulates with RW, dec weight shift/stance time w/ RLE  -nearly TTWB, dec velocity  TREATMENT DATE:  02/05/24:  Recumbent bike, seat 11, half revolutions, 5' Knee Drives and ext w/ OP at // bars, 8 inch step, 15x Heel raises on incline, 2x10, BUE support Toe raises, 2x10, BUE Support TKE, against BTB, 10 holds x15 Ambulating in parallel bars with LUE support only to mimic gait with SPC, 10x, v cues for heel strike and heel off to normalize gait pattern STS from chair height, 2x10 AROM measurements: 89 flexion , -6 extension  Instruction and print out of patella glides   02/01/24: NuStep, seat 9, level 1, 5 minutes  STM to R knee musculature, 8' Supine quad set, squeeze ball under knee, 5 holds, 10x SAQ, 10x, half foam roll under knee, v cues for form Supine heel slides w/ strap, 5 holds, 10x AROM measurement: ext -8, flexion 80 Seated heel slides, 10x Knee drives at staircase to max flexion and extension, 10x STS from chair level, 10x, emphasis on equal weight between feet                                   12/05/24/23 EXERCISE LOG  Exercise Repetitions and Resistance Comments  Nustep  L1 x 5 minutes @ seat 9   2 minute walk test   143 feet  With RW  Standing gastroc stretch  3 x 30 seconds    Lunges onto step  2.5 minutes    Standing hamstring stretch   3 x 30 seconds    Squatting  10 reps    Patient education  See below     Blank cell = exercise not performed  today   PATIENT EDUCATION:  Education details: goals for physical therapy, healing, x-ray results, anatomy, and expectations for soreness Person educated: Patient Education method: Explanation Education comprehension: verbalized understanding  HOME EXERCISE PROGRAM: Access Code: [email or web address] URL: [email or web address] Date: 02/05/2024 Prepared by: Rosaria Powell-Butler  Exercises - Supine Ankle Pumps  - 3 x daily - 7 x weekly - 3 sets - 10 reps - Quad Setting and Stretching  - 10 x daily - 7 x weekly - 3 sets - 10 reps - 5-10 hold - Supine Heel Slide with Strap  - 2 x daily - 7 x weekly - 3 sets - 10 reps - 5-10 hold - Sit to Stand Without Arm Support  - 2 x daily - 7 x weekly - 3 sets - 10 reps - Seated Heel Slide  - 2 x daily - 7 x weekly - 3 sets - 10 reps - 5-10 hold   - Long Sitting 4 Way Patellar Glide  - 1 x daily - 7 x weekly - 3 sets - 10 reps  ASSESSMENT:  CLINICAL IMPRESSION: Began session on recumbent bike for general R knee mobility with pt demonstrating improved flexion ROM. Followed with knee mobility and strengthening exercises at parallel bars. Pt with small standing rest breaks throughout session. Towards end of session, practice ambulating in parallel bars with LUE support only to practice decreased AD support. Pt demonstrates dec knee flexion and extension throughout. Improvements with verbal cueing. AROM measurements above. Gave patient print off and instruction of patella glides at end of session to aid in ROM. Patient continues to require skilled physical therapy to address her remaining impairments to return to her prior level of function.      Eval: Patient is a 63 y.o. male who was seen today for physical therapy evaluation and treatment  for  Z96.651 (ICD-10-CM) - Presence of right artificial knee joint  . On this date, patient demonstrates Impaired self perception of function, decreased ROM in R knee, decreased strength in RLE, impaired balance,  and increased pain all of which may be contributing to patient's altered gait, difficulty with functional transfers, increased fall risk, difficulty with completing ADLs, and decreased endurance/activity tolerance. Educated on signs of DVT, appropriate resting positions for knee (can prop knee up as long as ankle is higher than knee to prevent risk of contractures), and importance of compression, and elevation. PT and wife report understanding of all. Patient will benefit from continued skilled physical therapy in order to address the above deficits and improve overall function.   OBJECTIVE IMPAIRMENTS: Abnormal gait, decreased activity tolerance, decreased balance, decreased endurance, decreased mobility, difficulty walking, decreased ROM, decreased strength, increased edema, impaired perceived functional ability, impaired flexibility, improper body mechanics, postural dysfunction, and pain.   ACTIVITY LIMITATIONS: carrying, lifting, bending, sitting, standing, squatting, stairs, transfers, bed mobility, bathing, and dressing  PARTICIPATION LIMITATIONS: meal prep, cleaning, laundry, driving, community activity, occupation, and yard work  PERSONAL FACTORS: N/A are also affecting patient's functional outcome.   REHAB POTENTIAL: Good  CLINICAL DECISION MAKING: Stable/uncomplicated  EVALUATION COMPLEXITY: Low   GOALS: Goals reviewed with patient? No  SHORT TERM GOALS: Target date: 02/23/23 Patient will be independent with performance of HEP to demonstrate adequate self management of symptoms.  Baseline:  Goal status: INITIAL  2.   Patient will report at least a 25% improvement with function and/or pain reduction overall since beginning PT. Baseline:  Goal status: INITIAL   LONG TERM GOALS: Target date: 03/23/23 Patient will improve LEFS score by 9 points to demonstrate improved perceived function while meeting MCID.  Baseline: Goal status: INITIAL 2.  Patient will improve  R knee flexion   ROM to at least 120 degrees to demonstrate improved LE mobility needed for functional transfers and gait mechanics.  Baseline:  Goal status: INITIAL 3.  Patient will improve  R knee extension  ROM to at least 0 degrees to demonstrate improved LE mobility needed for functional transfers and gait mechanics.  Baseline:  Goal status: INITIAL 4.  Patient will improve 5 times sit to stand test by at least 12 seconds to demonstrate increased LE strength and/or power needed to improve functional transfers.  Baseline:  Goal status: INITIAL   5.  Patient will improve TUG score to 12 seconds or less with least restrictive assistive device to demonstrate improved fall risk.  Baseline: Goal status: INITIAL    PLAN:  PT FREQUENCY: 2x/week  PT DURATION: 8 weeks  PLANNED INTERVENTIONS: [id number]- PT Re-evaluation, 97110-Therapeutic exercises, 97530- Therapeutic activity, [id number]- Neuromuscular re-education, 97535- Self Care, [id number]- Manual therapy, [id number]- Gait training, [phone]- Electrical stimulation (manual), [id number]- Ultrasound, [id number]- Traction (mechanical), [phone] (1-2 muscles), [id number] (3+ muscles)- Dry Needling, Patient/Family education, Balance training, Stair training, Taping, Joint mobilization, Spinal mobilization, Scar mobilization, Cryotherapy, and Moist heat  PLAN FOR NEXT SESSION: Review HEP and goals, prog LE mobility and strength, balance interventions, manual as appropriate    3:04 PM, 02/05/2024 Kamara Allan Powell-Butler, PT, DPT [facility] [place]

## 2024-02-08 ENCOUNTER — Telehealth (HOSPITAL_COMMUNITY): Payer: Self-pay

## 2024-02-08 ENCOUNTER — Ambulatory Visit (HOSPITAL_COMMUNITY)

## 2024-02-08 NOTE — Telephone Encounter (Signed)
 Called patient concerning missed appointment this date. Left message with Reminder of No Show policy and next scheduled appointment on 02/12/24.   2:35 PM, 02/08/2024 Rosaria Settler, PT, DPT [facility] [place]

## 2024-02-12 ENCOUNTER — Encounter (HOSPITAL_COMMUNITY): Payer: Self-pay

## 2024-02-12 ENCOUNTER — Ambulatory Visit (HOSPITAL_COMMUNITY)

## 2024-02-12 DIAGNOSIS — M25661 Stiffness of right knee, not elsewhere classified: Secondary | ICD-10-CM

## 2024-02-12 DIAGNOSIS — R29898 Other symptoms and signs involving the musculoskeletal system: Secondary | ICD-10-CM

## 2024-02-12 DIAGNOSIS — Z7409 Other reduced mobility: Secondary | ICD-10-CM

## 2024-02-12 DIAGNOSIS — M25561 Pain in right knee: Secondary | ICD-10-CM

## 2024-02-12 NOTE — Therapy (Addendum)
 " OUTPATIENT PHYSICAL THERAPY LOWER EXTREMITY TREATMENT   Patient Name: Alex Taylor MRN: [id number] DOB:Sep 27, 1960, 63 y.o., male Today's Date: 02/12/2024  END OF SESSION:  PT End of Session - 02/12/24 1420     Visit Number 5    Number of Visits 16    Date for Recertification  02/19/24    Authorization Type Generic [employer]    Authorization Time Period No auth    Progress Note Due on Visit 10    PT Start Time 1420    PT Stop Time 1500    PT Time Calculation (min) 40 min    Activity Tolerance Patient tolerated treatment well    Behavior During Therapy WFL for tasks assessed/performed           Past Medical History:  Diagnosis Date   Arthritis    Diabetes mellitus without complication (HCC)    History of kidney stones    Hypertension    Past Surgical History:  Procedure Laterality Date   APPENDECTOMY     BACK SURGERY     COLONOSCOPY  12/20/2011   Procedure: COLONOSCOPY;  Surgeon: Oneil DELENA Budge, MD;  Location: [facility];  Service: Gastroenterology;  Laterality: N/A;   FINGER SURGERY     KNEE ARTHROSCOPY     left knee   TOTAL KNEE ARTHROPLASTY Right 01/16/2024   Procedure: ARTHROPLASTY, KNEE, TOTAL;  Surgeon: Josefina Chew, MD;  Location: [facility];  Service: Orthopedics;  Laterality: Right;   Patient Active Problem List   Diagnosis Date Noted   DM (diabetes mellitus) (HCC) 03/21/2012   Difficulty walking 10/28/2011   Open wound of hand except fingers 05/29/2008   Open wound of finger with tendon involvement 05/29/2008    PCP: Dow Longs, PA-C  REFERRING PROVIDER: Josefina Chew, MD  REFERRING DIAG:  [phone] (ICD-10-CM) - Presence of right artificial knee joint    THERAPY DIAG:  Decreased range of motion (ROM) of right knee  Impaired functional mobility, balance, gait, and endurance  Acute pain of right knee  Decreased strength of lower extremity  Rationale for Evaluation and Treatment: Rehabilitation  ONSET DATE: 01/16/24  SUBJECTIVE:    SUBJECTIVE STATEMENT: Pt reports he's been sick. That's why he missed his last appointment. starting to feel better. Wearing mask today. Ambulates with RW into session. Reports 3/10 pain today but hasn't done much. Reports he's been walking in home some without AD. Reports no falls/trips.     Eval: Patient reports having surgery on 11/25. Reports he's tried some exercises at home that they gave him from hospital but pain and swelling restricting him some. Didn't get home therapy, and no CPM machine.    PERTINENT HISTORY: [name] is a  63 y.o. male with a diagnosis of right knee OA who failed conservative measures and elected for surgical management.  He actually had worse radiographic disease on the left knee than the right knee, but his right knee pain was severe and he had bone marrow edema on the medial and patellofemoral joint seen on MRI.  We discussed the possibility of injections, observation, more time, versus arthroscopic treatment, versus partial knee replacement versus total knee replacement, and the patient wished for total knee replacement. PAIN:  Are you having pain? Yes: NPRS scale: 6-7/10 Pain location: Entire R knee Pain description: Achy Aggravating factors: Walking, movement Relieving factors: Rest, medications some, Ice  PRECAUTIONS: None  RED FLAGS: None   WEIGHT BEARING RESTRICTIONS: No  FALLS:  Has patient fallen in last  6 months? 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  DIAGNOSTIC FINDINGS:  IMPRESSION: 1. Right total knee prosthesis in place without visible periprosthetic fracture or acute bony findings. 2. Expected gas in the joint and soft tissues. 3. No complicating feature on this 2 view series.  PATIENT SURVEYS:  LEFS  Extreme difficulty/unable (0), Quite a bit of difficulty (1), Moderate difficulty (2), Little difficulty (3), No difficulty (4) Survey date:    Any of your usual work, housework or school activities   2. Usual hobbies, recreational or sporting activities   3. Getting into/out of the bath   4. Walking between rooms   5. Putting on socks/shoes   6. Squatting    7. Lifting an object, like a bag of groceries from the floor   8. Performing light activities around your home   9. Performing heavy activities around your home   10. Getting into/out of a car   11. Walking 2 blocks   12. Walking 1 mile   13. Going up/down 10 stairs (1 flight)   14. Standing for 1 hour   15.  sitting for 1 hour   16. Running on even ground   17. Running on uneven ground   18. Making sharp turns while running fast   19. Hopping    20. Rolling over in bed   Score total:  Lower Extremity Functional Score: 5 / 80 = 6.3 %      COGNITION: Overall cognitive status: Within functional limits for tasks assessed     SENSATION: Light touch: WFL  EDEMA:  Mild-moderate edema in R knee joint and R gastroc muscle R  PALPATION: TTP on medial and lateral poles of R knee joint   LOWER EXTREMITY ROM:  Active ROM Right eval Left eval  Hip flexion    Hip extension    Hip abduction    Hip adduction    Hip internal rotation    Hip external rotation    Knee flexion 66 WFL  Knee extension -10 0  Ankle dorsiflexion    Ankle plantarflexion    Ankle inversion    Ankle eversion     (Blank rows = not tested)  LOWER EXTREMITY MMT:  MMT Right eval Left eval  Hip flexion    Hip extension    Hip abduction    Hip adduction    Hip internal  rotation    Hip external rotation    Knee flexion    Knee extension    Ankle dorsiflexion    Ankle plantarflexion    Ankle inversion    Ankle eversion     (Blank rows = not tested)  LOWER EXTREMITY SPECIAL TESTS:    FUNCTIONAL TESTS:  5 times sit to stand: 42 seconds Timed up and go (TUG):  54 seconds w/ RW 2 minute walk test: 143 feet with RW on 01/25/24  GAIT: Distance walked: at least 100 ft in session Assistive device utilized: Walker - 2 wheeled Level of assistance: Modified independence Comments:  Ambulates with RW, dec weight shift/stance time w/ RLE  -nearly TTWB, dec velocity                                                                                                                                TREATMENT DATE:  02/12/24: Recumbent bike, seat 10, half revolutions, 5' Gait trial with walking stick, 241 ft, v cues for heel strike, heel off and allowing knee flexion to normalize gait pattern, walking stick on L hand Knee Drives and ext w/ OP at // bars, 8 inch step, 2x10 TKE, against BTB, 2x10 Forward step ups, 4 inch step, BUE use, 10x  Prog to 6 inch step, one UE use, 10x Obstacle navigation in // bars, 2 six inch and 1 twelve inch, forward and side stepping, 10x total, verbal cues to limit circumduction AROM: -7 ext to 100 flexion    02/05/24:  Recumbent bike, seat 11, half revolutions, 5' Knee Drives and ext w/ OP at // bars, 8 inch step, 15x Heel raises on incline, 2x10, BUE support Toe raises, 2x10, BUE Support TKE, against BTB, 10 holds x15 Ambulating in parallel bars with LUE support only to mimic gait with SPC, 10x, v cues for heel strike and heel off to normalize gait pattern STS from chair height, 2x10 AROM measurements: 89 flexion , -6 extension  Instruction and print out of patella glides   02/01/24: NuStep, seat 9, level 1, 5 minutes  STM to R knee musculature, 8' Supine quad set, squeeze ball under knee, 5 holds, 10x SAQ, 10x, half foam  roll under knee, v cues for form Supine heel slides w/ strap, 5 holds, 10x AROM measurement: ext -8, flexion 80 Seated heel slides, 10x Knee drives at staircase to max flexion and extension, 10x STS from chair level, 10x, emphasis on equal weight between feet                                PATIENT EDUCATION:  Education details: goals for physical therapy, healing, x-ray results, anatomy, and expectations for soreness Person educated: Patient Education method: Explanation Education comprehension: verbalized understanding  HOME EXERCISE PROGRAM: Access Code: [email or web address] URL: [email or web address] Date: 02/05/2024 Prepared by: Rosaria Powell-Butler  Exercises - Supine Ankle Pumps  - 3 x daily - 7 x weekly - 3 sets - 10 reps - Quad Setting and Stretching  - 10 x daily - 7 x weekly - 3 sets - 10 reps - 5-10 hold - Supine Heel Slide with Strap  - 2 x daily - 7 x weekly - 3 sets - 10 reps - 5-10 hold - Sit to Stand Without Arm Support  - 2 x daily - 7 x weekly - 3 sets - 10 reps - Seated Heel Slide  - 2 x daily - 7 x weekly - 3 sets - 10 reps - 5-10 hold   - Long  Sitting 4 Way Patellar Glide  - 1 x daily - 7 x weekly - 3 sets - 10 reps   Exercises - Prone Knee Extension Hang  - 2 x daily - 7 x weekly - 5 min hold - Prone Knee Extension with Ankle Weight  - 2 x daily - 7 x weekly - 5 min hold  ASSESSMENT:  CLINICAL IMPRESSION: Began session on recumbent bike for general R knee mobility with pt demonstrating improved flexion ROM. Followed with gait trial with walking stick. Pt demonstrates overall good balance but R knee stiffness throughout. Improves with verbal cues for knee flexion and heel strike/toe off. Followed with knee mobility exercises/activities. Pt overall with good carryover throughout. Improved knee flexion ROM measured this date but still difficulty with extension. Added prone knee hang to HEP. Patient continues to require skilled physical therapy to address her  remaining impairments to return to her prior level of function.       Eval: Patient is a 63 y.o. male who was seen today for physical therapy evaluation and treatment for  Z96.651 (ICD-10-CM) - Presence of right artificial knee joint  . On this date, patient demonstrates Impaired self perception of function, decreased ROM in R knee, decreased strength in RLE, impaired balance, and increased pain all of which may be contributing to patient's altered gait, difficulty with functional transfers, increased fall risk, difficulty with completing ADLs, and decreased endurance/activity tolerance. Educated on signs of DVT, appropriate resting positions for knee (can prop knee up as long as ankle is higher than knee to prevent risk of contractures), and importance of compression, and elevation. PT and wife report understanding of all. Patient will benefit from continued skilled physical therapy in order to address the above deficits and improve overall function.   OBJECTIVE IMPAIRMENTS: Abnormal gait, decreased activity tolerance, decreased balance, decreased endurance, decreased mobility, difficulty walking, decreased ROM, decreased strength, increased edema, impaired perceived functional ability, impaired flexibility, improper body mechanics, postural dysfunction, and pain.   ACTIVITY LIMITATIONS: carrying, lifting, bending, sitting, standing, squatting, stairs, transfers, bed mobility, bathing, and dressing  PARTICIPATION LIMITATIONS: meal prep, cleaning, laundry, driving, community activity, occupation, and yard work  PERSONAL FACTORS: N/A are also affecting patient's functional outcome.   REHAB POTENTIAL: Good  CLINICAL DECISION MAKING: Stable/uncomplicated  EVALUATION COMPLEXITY: Low   GOALS: Goals reviewed with patient? No  SHORT TERM GOALS: Target date: 02/23/23 Patient will be independent with performance of HEP to demonstrate adequate self management of symptoms.  Baseline:  Goal status:  INITIAL  2.   Patient will report at least a 25% improvement with function and/or pain reduction overall since beginning PT. Baseline:  Goal status: INITIAL   LONG TERM GOALS: Target date: 03/23/23 Patient will improve LEFS score by 9 points to demonstrate improved perceived function while meeting MCID.  Baseline: Goal status: INITIAL 2.  Patient will improve  R knee flexion  ROM to at least 120 degrees to demonstrate improved LE mobility needed for functional transfers and gait mechanics.  Baseline:  Goal status: INITIAL 3.  Patient will improve  R knee extension  ROM to at least 0 degrees to demonstrate improved LE mobility needed for functional transfers and gait mechanics.  Baseline:  Goal status: INITIAL 4.  Patient will improve 5 times sit to stand test by at least 12 seconds to demonstrate increased LE strength and/or power needed to improve functional transfers.  Baseline:  Goal status: INITIAL   5.  Patient will improve TUG score to 12  seconds or less with least restrictive assistive device to demonstrate improved fall risk.  Baseline: Goal status: INITIAL    PLAN:  PT FREQUENCY: 2x/week  PT DURATION: 8 weeks  PLANNED INTERVENTIONS: [id number]- PT Re-evaluation, 97110-Therapeutic exercises, 97530- Therapeutic activity, [id number]- Neuromuscular re-education, 97535- Self Care, [id number]- Manual therapy, [id number]- Gait training, [phone]- Electrical stimulation (manual), [id number]- Ultrasound, 02987- Traction (mechanical), [phone] (1-2 muscles), [id number] (3+ muscles)- Dry Needling, Patient/Family education, Balance training, Stair training, Taping, Joint mobilization, Spinal mobilization, Scar mobilization, Cryotherapy, and Moist heat  PLAN FOR NEXT SESSION: Review HEP and goals, prog LE mobility and strength, balance interventions, manual as appropriate    3:07 PM, 02/12/2024 Admir Candelas Powell-Butler, PT, DPT [facility] [place]  "

## 2024-02-14 ENCOUNTER — Ambulatory Visit (HOSPITAL_COMMUNITY)

## 2024-02-19 ENCOUNTER — Ambulatory Visit (HOSPITAL_COMMUNITY)

## 2024-02-19 ENCOUNTER — Encounter (HOSPITAL_COMMUNITY): Payer: Self-pay

## 2024-02-19 DIAGNOSIS — R29898 Other symptoms and signs involving the musculoskeletal system: Secondary | ICD-10-CM

## 2024-02-19 DIAGNOSIS — Z7409 Other reduced mobility: Secondary | ICD-10-CM

## 2024-02-19 DIAGNOSIS — M25661 Stiffness of right knee, not elsewhere classified: Secondary | ICD-10-CM | POA: Diagnosis not present

## 2024-02-19 DIAGNOSIS — M25561 Pain in right knee: Secondary | ICD-10-CM

## 2024-02-19 NOTE — Therapy (Signed)
 " OUTPATIENT PHYSICAL THERAPY LOWER EXTREMITY TREATMENT   Patient Name: Alex Taylor MRN: [id number] DOB:[birth year], 63 y.o., male Today's Date: 02/19/2024  Progress Note Reporting Period 01/22/24 to 02/19/24  See note below for Objective Data and Assessment of Progress/Goals.    END OF SESSION:  PT End of Session - 02/19/24 1421     Visit Number 6    Number of Visits 16    Date for Recertification  03/18/24    Authorization Type Generic [employer]    Authorization Time Period No auth    Progress Note Due on Visit 16    PT Start Time 1420    PT Stop Time 1500    PT Time Calculation (min) 40 min    Activity Tolerance Patient tolerated treatment well    Behavior During Therapy WFL for tasks assessed/performed           Past Medical History:  Diagnosis Date   Arthritis    Diabetes mellitus without complication (HCC)    History of kidney stones    Hypertension    Past Surgical History:  Procedure Laterality Date   APPENDECTOMY     BACK SURGERY     COLONOSCOPY  12/20/2011   Procedure: COLONOSCOPY;  Surgeon: Oneil DELENA Budge, MD;  Location: [facility];  Service: Gastroenterology;  Laterality: N/A;   FINGER SURGERY     KNEE ARTHROSCOPY     left knee   TOTAL KNEE ARTHROPLASTY Right 01/16/2024   Procedure: ARTHROPLASTY, KNEE, TOTAL;  Surgeon: Josefina Chew, MD;  Location: [facility];  Service: Orthopedics;  Laterality: Right;   Patient Active Problem List   Diagnosis Date Noted   DM (diabetes mellitus) (HCC) 03/21/2012   Difficulty walking 10/28/2011   Open wound of hand except fingers 05/29/2008   Open wound of finger with tendon involvement 05/29/2008    PCP: Dow Longs, PA-C  REFERRING PROVIDER: Josefina Chew, MD  REFERRING DIAG:  [phone] (ICD-10-CM) - Presence of right artificial knee joint    THERAPY DIAG:  Decreased range of motion (ROM) of right knee  Impaired functional mobility, balance, gait, and endurance  Acute pain of right knee  Decreased  strength of lower extremity  Rationale for Evaluation and Treatment: Rehabilitation  ONSET DATE: 01/16/24  SUBJECTIVE:   SUBJECTIVE STATEMENT: Pt reports he's been feeling bad over the holiday. Reports he was around a lot of kids. Reports he's still been trying to do some of his HEP program because it gets stiff if he doesn't do it. Reports stiffness today around 5/10. Ambulates into session with RW. Still using RW in home.      Eval: Patient reports having surgery on 11/25. Reports he's tried some exercises at home that they gave him from hospital but pain and swelling restricting him some. Didn't get home therapy, and no CPM machine.    PERTINENT HISTORY: Alex Taylor is a  63 y.o. male with a diagnosis of right knee OA who failed conservative measures and elected for surgical management.  He actually had worse radiographic disease on the left knee than the right knee, but his right knee pain was severe and he had bone marrow edema on the medial and patellofemoral joint seen on MRI.  We discussed the possibility of injections, observation, more time, versus arthroscopic treatment, versus partial knee replacement versus total knee replacement, and the patient wished for total knee replacement. PAIN:  Are you having pain? Yes: NPRS scale: 6-7/10 Pain location: Entire R knee Pain description: Achy  Aggravating factors: Walking, movement Relieving factors: Rest, medications some, Ice  PRECAUTIONS: None  RED FLAGS: None   WEIGHT BEARING RESTRICTIONS: No  FALLS:  Has patient fallen in last 6 months? 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  DIAGNOSTIC FINDINGS:  IMPRESSION: 1. Right total knee prosthesis in place without visible periprosthetic fracture or acute bony findings. 2. Expected gas in the joint and soft tissues. 3. No complicating feature on this 2 view series.  PATIENT SURVEYS: LEFS  Extreme difficulty/unable (0), Quite a bit of difficulty (1), Moderate difficulty (2), Little difficulty (3), No difficulty (4) Survey date:    Any of your usual work, housework or school activities   2. Usual hobbies, recreational or sporting activities   3. Getting into/out of the bath   4. Walking between rooms   5. Putting on socks/shoes   6. Squatting    7. Lifting an object, like a bag of groceries from the floor   8. Performing light activities around your home   9. Performing heavy activities around your home   10. Getting into/out of a car   11. Walking 2 blocks   12. Walking 1 mile   13. Going up/down 10 stairs (1 flight)   14. Standing for 1 hour   15.  sitting for 1 hour   16. Running on even ground   17. Running on uneven ground   18. Making sharp turns while running fast   19. Hopping    20. Rolling over in bed   Score total:  Lower Extremity Functional Score: 5 / 80 = 6.3 %     02/19/24: Lower Extremity Functional Score: 31 / 80 = 38.8 %    COGNITION: Overall cognitive status: Within functional limits for tasks assessed     SENSATION: Light touch: WFL  EDEMA:  Mild-moderate edema in R knee joint and R gastroc muscle R  PALPATION: TTP on medial and lateral poles of R knee joint   LOWER EXTREMITY ROM:  Active ROM Right eval Left eval Right 02/19/24  Hip flexion     Hip extension     Hip abduction     Hip adduction     Hip internal rotation     Hip external rotation     Knee flexion 66 WFL 104  Knee extension -10 0 -4  Ankle dorsiflexion      Ankle plantarflexion     Ankle inversion     Ankle eversion      (Blank rows = not tested)  LOWER EXTREMITY MMT:  MMT Right eval Left eval  Hip flexion    Hip extension    Hip abduction    Hip adduction    Hip internal rotation    Hip external rotation    Knee flexion    Knee extension    Ankle dorsiflexion    Ankle plantarflexion    Ankle inversion    Ankle eversion     (Blank rows = not tested)  LOWER EXTREMITY SPECIAL  TESTS:    FUNCTIONAL TESTS:  5 times sit to stand: 42 seconds Timed up and go (TUG):  54 seconds w/ RW 2 minute walk test: 143 feet with RW on 01/25/24  02/19/24: TUG: 28 seconds, no AD 5 times sit to stand: 17 seconds, BUE use to push off 2 minute walk test: 264 ft with RW   GAIT: Distance walked: at least 100 ft in session Assistive device utilized: Environmental Consultant - 2 wheeled Level of assistance: Modified independence Comments: Ambulates with RW, dec weight shift/stance time w/ RLE  -nearly TTWB, dec velocity                                                                                                                                TREATMENT DATE:  02/19/24: Recumbent bike, seat 10, half revolutions, 5'  Progress note:  LEFS TUG 5 times sit to stand 2 minute walk test LE ROM: -4 from 0 extension, 104 flexion  Review of goals Gait trial with SPC, 225 ft, verbal cues for form  LE Press: plate 3 - 7k89, plate 4 - 7k89   02/12/24: Recumbent bike, seat 10, half revolutions, 5' Gait trial with walking stick, 241 ft, v cues for heel strike, heel off and allowing knee flexion to normalize gait pattern, walking stick on L hand Knee Drives and ext w/ OP at // bars, 8 inch step, 2x10 TKE, against BTB, 2x10 Forward step ups, 4 inch step, BUE use, 10x  Prog to 6 inch step, one UE use, 10x Obstacle navigation in // bars, 2 six inch and 1 twelve inch, forward and side stepping, 10x total, verbal cues to limit circumduction AROM: -7 ext to 100  flexion    02/05/24:  Recumbent bike, seat 11, half revolutions, 5' Knee Drives and ext w/ OP at // bars, 8 inch step, 15x Heel raises on incline, 2x10, BUE support Toe raises, 2x10, BUE Support TKE, against BTB, 10 holds x15 Ambulating in parallel bars with LUE support only to mimic gait with SPC, 10x, v cues for heel strike and heel off to normalize gait pattern STS from chair height, 2x10 AROM measurements: 89 flexion , -6 extension  Instruction and print out of patella glides                                  PATIENT EDUCATION:  Education details: goals for physical therapy, healing, x-ray results, anatomy, and expectations for soreness Person educated: Patient Education method: Explanation Education comprehension: verbalized understanding  HOME EXERCISE PROGRAM: Access Code: [email or web address] URL: [email or web address] Date: 02/05/2024 Prepared by: Rosaria Powell-Butler  Exercises - Supine Ankle Pumps  - 3 x daily - 7 x weekly - 3 sets - 10 reps - Quad Setting and Stretching  - 10 x daily - 7 x weekly - 3 sets - 10 reps - 5-10 hold - Supine Heel Slide  with Strap  - 2 x daily - 7 x weekly - 3 sets - 10 reps - 5-10 hold - Sit to Stand Without Arm Support  - 2 x daily - 7 x weekly - 3 sets - 10 reps - Seated Heel Slide  - 2 x daily - 7 x weekly - 3 sets - 10 reps - 5-10 hold   - Long Sitting 4 Way Patellar Glide  - 1 x daily - 7 x weekly - 3 sets - 10 reps   Exercises - Prone Knee Extension Hang  - 2 x daily - 7 x weekly - 5 min hold - Prone Knee Extension with Ankle Weight  - 2 x daily - 7 x weekly - 5 min hold  ASSESSMENT:  CLINICAL IMPRESSION: Progress note completed this date. Patient demonstrates good improvements with self perceived function, LE ROM, LE strength, decreased fall risk, improved gait mechanics, and improved endurance. Patient reports feeling at least 50% improved since beginning PT and has met 4 of all objective goals. Patient continues to  utilize RW for ambulation. Practiced ambulation with walking stick last session and SPC this session with pt demonstrating overall good mechanics and no overt LOB or unsteadiness. Highly encouraged discontinued use of RW, especially in home, to progress towards gait prior to TKA. PT reports understanding.  Patient continues to require skilled physical therapy to address her remaining impairments to return to her prior level of function.         Eval: Patient is a 63 y.o. male who was seen today for physical therapy evaluation and treatment for  Z96.651 (ICD-10-CM) - Presence of right artificial knee joint  . On this date, patient demonstrates Impaired self perception of function, decreased ROM in R knee, decreased strength in RLE, impaired balance, and increased pain all of which may be contributing to patient's altered gait, difficulty with functional transfers, increased fall risk, difficulty with completing ADLs, and decreased endurance/activity tolerance. Educated on signs of DVT, appropriate resting positions for knee (can prop knee up as long as ankle is higher than knee to prevent risk of contractures), and importance of compression, and elevation. PT and wife report understanding of all. Patient will benefit from continued skilled physical therapy in order to address the above deficits and improve overall function.   OBJECTIVE IMPAIRMENTS: Abnormal gait, decreased activity tolerance, decreased balance, decreased endurance, decreased mobility, difficulty walking, decreased ROM, decreased strength, increased edema, impaired perceived functional ability, impaired flexibility, improper body mechanics, postural dysfunction, and pain.   ACTIVITY LIMITATIONS: carrying, lifting, bending, sitting, standing, squatting, stairs, transfers, bed mobility, bathing, and dressing  PARTICIPATION LIMITATIONS: meal prep, cleaning, laundry, driving, community activity, occupation, and yard work  PERSONAL  FACTORS: N/A are also affecting patient's functional outcome.   REHAB POTENTIAL: Good  CLINICAL DECISION MAKING: Stable/uncomplicated  EVALUATION COMPLEXITY: Low   GOALS: Goals reviewed with patient? No  SHORT TERM GOALS: Target date: 02/23/23 Patient will be independent with performance of HEP to demonstrate adequate self management of symptoms.  Baseline: Reports compliance to HEP everyday Goal status: MET  2.   Patient will report at least a 25% improvement with function and/or pain reduction overall since beginning PT. Baseline: Reports at least 50% improved on 02/19/24 Goal status: MET   LONG TERM GOALS: Target date: 03/23/23 Patient will improve LEFS score by 50 points  from original to demonstrate improved perceived function while meeting MCID.  Baseline: 12/29: 31/80 Goal status: Revised on 12/29 2.  Patient will improve  R knee flexion  ROM to at least 120 degrees to demonstrate improved LE mobility needed for functional transfers and gait mechanics.  Baseline:  Goal status: IN PROGRESS 3.  Patient will improve  R knee extension  ROM to at least 0 degrees to demonstrate improved LE mobility needed for functional transfers and gait mechanics.  Baseline:  Goal status: IN PROGRESS 4.  Patient will improve 5 times sit to stand test by at least 12 seconds to demonstrate increased LE strength and/or power needed to improve functional transfers.  Baseline: see above Goal status: MET   5.  Patient will improve TUG score to 12 seconds or less with least restrictive assistive device to demonstrate improved fall risk.  Baseline: see above  Goal status: IN PROGRESS    PLAN:  PT FREQUENCY: 2x/week  PT DURATION: 8 weeks  PLANNED INTERVENTIONS: 97164- PT Re-evaluation, 97110-Therapeutic exercises, 97530- Therapeutic activity, [id number]- Neuromuscular re-education, 97535- Self Care, 02859- Manual therapy, [id number]- Gait training, [phone]- Electrical stimulation (manual), [id number]-  Ultrasound, 02987- Traction (mechanical), [phone] (1-2 muscles), 20561 (3+ muscles)- Dry Needling, Patient/Family education, Balance training, Stair training, Taping, Joint mobilization, Spinal mobilization, Scar mobilization, Cryotherapy, and Moist heat  PLAN FOR NEXT SESSION: LE mobility and strength, balance interventions, manual as appropriate     5:12 PM, 02/19/2024 Jobina Maita Powell-Butler, PT, DPT [facility] [place] "

## 2024-02-19 NOTE — Addendum Note (Signed)
 Addended by: LUDGER HEART E on: 02/19/2024 05:19 PM   Modules accepted: Orders

## 2024-02-19 NOTE — Addendum Note (Signed)
 Addended by: LUDGER HEART E on: 02/19/2024 05:21 PM   Modules accepted: Orders

## 2024-02-21 ENCOUNTER — Other Ambulatory Visit (HOSPITAL_COMMUNITY): Payer: Self-pay

## 2024-02-21 ENCOUNTER — Ambulatory Visit (HOSPITAL_COMMUNITY)

## 2024-02-21 ENCOUNTER — Other Ambulatory Visit (HOSPITAL_BASED_OUTPATIENT_CLINIC_OR_DEPARTMENT_OTHER): Payer: Self-pay

## 2024-02-21 MED ORDER — HYDROCODONE-ACETAMINOPHEN 10-325 MG PO TABS
1.0000 | ORAL_TABLET | ORAL | 0 refills | Status: AC | PRN
  Filled 2024-02-21: qty 30, 5d supply, fill #0

## 2024-02-23 ENCOUNTER — Other Ambulatory Visit (HOSPITAL_BASED_OUTPATIENT_CLINIC_OR_DEPARTMENT_OTHER): Payer: Self-pay

## 2024-02-26 ENCOUNTER — Encounter (HOSPITAL_COMMUNITY): Payer: Self-pay

## 2024-02-26 ENCOUNTER — Ambulatory Visit (HOSPITAL_COMMUNITY): Attending: Orthopedic Surgery

## 2024-02-26 DIAGNOSIS — M25561 Pain in right knee: Secondary | ICD-10-CM | POA: Insufficient documentation

## 2024-02-26 DIAGNOSIS — M25661 Stiffness of right knee, not elsewhere classified: Secondary | ICD-10-CM | POA: Insufficient documentation

## 2024-02-26 DIAGNOSIS — R29898 Other symptoms and signs involving the musculoskeletal system: Secondary | ICD-10-CM | POA: Diagnosis present

## 2024-02-26 DIAGNOSIS — Z7409 Other reduced mobility: Secondary | ICD-10-CM | POA: Diagnosis present

## 2024-02-26 NOTE — Therapy (Signed)
 " OUTPATIENT PHYSICAL THERAPY LOWER EXTREMITY TREATMENT   Patient Name: Alex Taylor MRN: [id number] DOB:[birth year], 64 y.o., male Today's Date: 02/26/2024  END OF SESSION:  PT End of Session - 02/26/24 1421     Visit Number 7    Number of Visits 16    Date for Recertification  03/18/24    Authorization Type Generic [employer]    Authorization Time Period No auth    Progress Note Due on Visit 16    PT Start Time 1420    PT Stop Time 1500    PT Time Calculation (min) 40 min    Activity Tolerance Patient tolerated treatment well    Behavior During Therapy WFL for tasks assessed/performed           Past Medical History:  Diagnosis Date   Arthritis    Diabetes mellitus without complication (HCC)    History of kidney stones    Hypertension    Past Surgical History:  Procedure Laterality Date   APPENDECTOMY     BACK SURGERY     COLONOSCOPY  12/20/2011   Procedure: COLONOSCOPY;  Surgeon: Oneil DELENA Budge, MD;  Location: [facility];  Service: Gastroenterology;  Laterality: N/A;   FINGER SURGERY     KNEE ARTHROSCOPY     left knee   TOTAL KNEE ARTHROPLASTY Right 01/16/2024   Procedure: ARTHROPLASTY, KNEE, TOTAL;  Surgeon: Josefina Chew, MD;  Location: [facility];  Service: Orthopedics;  Laterality: Right;   Patient Active Problem List   Diagnosis Date Noted   DM (diabetes mellitus) (HCC) 03/21/2012   Difficulty walking 10/28/2011   Open wound of hand except fingers 05/29/2008   Open wound of finger with tendon involvement 05/29/2008    PCP: Dow Longs, PA-C  REFERRING PROVIDER: Josefina Chew, MD  REFERRING DIAG:  [phone] (ICD-10-CM) - Presence of right artificial knee joint    THERAPY DIAG:  Decreased range of motion (ROM) of right knee  Impaired functional mobility, balance, gait, and endurance  Acute pain of right knee  Decreased strength of lower extremity  Rationale for Evaluation and Treatment: Rehabilitation  ONSET DATE: 01/16/24  SUBJECTIVE:    SUBJECTIVE STATEMENT: Pt reports he's having a tough day today. Woke up at 2 in the morning from pain on the inside of his R knee. Reports he think he may have slept or moved wrong. Its been swollen. He's been icing and elevating to help it. Reports 7-8/10 intensity.     Eval: Patient reports having surgery on 11/25. Reports he's tried some exercises at home that they gave him from hospital but pain and swelling restricting him some. Didn't get home therapy, and no CPM machine.    PERTINENT HISTORY: Alex Taylor is a  64 y.o. male with a diagnosis of right knee OA who failed conservative measures and elected for surgical management.  He actually had worse radiographic disease on the left knee than the right knee, but his right knee pain was severe and he had bone marrow edema on the medial and patellofemoral joint seen on MRI.  We discussed the possibility of injections, observation, more time, versus arthroscopic treatment, versus partial knee replacement versus total knee replacement, and the patient wished for total knee replacement. PAIN:  Are you having pain? Yes: NPRS scale: 6-7/10 Pain location: Entire R knee Pain description: Achy Aggravating factors: Walking, movement Relieving factors: Rest, medications some, Ice  PRECAUTIONS: None  RED FLAGS: None   WEIGHT BEARING RESTRICTIONS: No  FALLS:  Has  patient fallen in last 6 months? 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  DIAGNOSTIC  FINDINGS:  IMPRESSION: 1. Right total knee prosthesis in place without visible periprosthetic fracture or acute bony findings. 2. Expected gas in the joint and soft tissues. 3. No complicating feature on this 2 view series.  PATIENT SURVEYS: LEFS  Extreme difficulty/unable (0), Quite a bit of difficulty (1), Moderate difficulty (2), Little difficulty (3), No difficulty (4) Survey date:    Any of your usual work, housework or school activities   2. Usual hobbies, recreational or sporting activities   3. Getting into/out of the bath   4. Walking between rooms   5. Putting on socks/shoes   6. Squatting    7. Lifting an object, like a bag of groceries from the floor   8. Performing light activities around your home   9. Performing heavy activities around your home   10. Getting into/out of a car   11. Walking 2 blocks   12. Walking 1 mile   13. Going up/down 10 stairs (1 flight)   14. Standing for 1 hour   15.  sitting for 1 hour   16. Running on even ground   17. Running on uneven ground   18. Making sharp turns while running fast   19. Hopping    20. Rolling over in bed   Score total:  Lower Extremity Functional Score: 5 / 80 = 6.3 %     02/19/24: Lower Extremity Functional Score: 31 / 80 = 38.8 %    COGNITION: Overall cognitive status: Within functional limits for tasks assessed     SENSATION: Light touch: WFL  EDEMA:  Mild-moderate edema in R knee joint and R gastroc muscle R  PALPATION: TTP on medial and lateral poles of R knee joint   LOWER EXTREMITY ROM:  Active ROM Right eval Left eval Right 02/19/24  Hip flexion     Hip extension     Hip abduction     Hip adduction     Hip internal rotation     Hip external rotation     Knee flexion 66 WFL 104  Knee extension -10 0 -4  Ankle dorsiflexion     Ankle plantarflexion     Ankle inversion     Ankle eversion      (Blank rows = not tested)  LOWER EXTREMITY MMT:  MMT Right eval Left eval  Hip  flexion    Hip extension    Hip abduction    Hip adduction    Hip internal rotation    Hip external rotation    Knee flexion    Knee extension    Ankle dorsiflexion    Ankle plantarflexion    Ankle inversion    Ankle eversion     (Blank rows = not tested)  LOWER EXTREMITY SPECIAL TESTS:    FUNCTIONAL TESTS:  5 times sit to stand: 42 seconds Timed up and go (TUG):  54 seconds w/ RW 2 minute walk  test: 143 feet with RW on 01/25/24  02/19/24: TUG: 28 seconds, no AD 5 times sit to stand: 17 seconds, BUE use to push off 2 minute walk test: 264 ft with RW   GAIT: Distance walked: at least 100 ft in session Assistive device utilized: Environmental Consultant - 2 wheeled Level of assistance: Modified independence Comments: Ambulates with RW, dec weight shift/stance time w/ RLE  -nearly TTWB, dec velocity                                                                                                                                TREATMENT DATE:  02/26/24: Recumbent bike, seat 9, half to full revolutions at end of trial, 5'  Manual therapy: soft tissue massage to quads distal to proximal for edema control, STM to medial joint line, and patella mobilizations superior/inferior and medial/lateral AROM: 105 flexion, -3 degrees Contract/Relax into max flexion, 3 holds, 2x Passive knee extension, foot on stool, 5', at 2.5 min added 10 lb AW at knee AROM re-measure: -2 to 108     02/19/24: Recumbent bike, seat 10, half revolutions, 5'  Progress note:  LEFS TUG 5 times sit to stand 2 minute walk test LE ROM: -4 from 0 extension, 104 flexion  Review of goals Gait trial with SPC, 225 ft, verbal cues for form  LE Press: plate 3 - 7k89, plate 4 - 7k89   02/12/24: Recumbent bike, seat 10, half revolutions, 5' Gait trial with walking stick, 241 ft, v cues for heel strike, heel off and allowing knee flexion to normalize gait pattern, walking stick on L hand Knee Drives and ext w/ OP at // bars, 8  inch step, 2x10 TKE, against BTB, 2x10 Forward step ups, 4 inch step, BUE use, 10x  Prog to 6 inch step, one UE use, 10x Obstacle navigation in // bars, 2 six inch and 1 twelve inch, forward and side stepping, 10x total, verbal cues to limit circumduction AROM: -7 ext to 100 flexion                     PATIENT EDUCATION:  Education details: goals for physical therapy, healing, x-ray results, anatomy, and expectations for soreness Person educated: Patient Education method: Explanation Education comprehension: verbalized understanding  HOME EXERCISE PROGRAM: Access Code: [email or web address] URL: [email or web address] Date: 02/05/2024 Prepared by: Rosaria Powell-Butler  Exercises - Supine Ankle Pumps  - 3 x daily - 7 x weekly - 3 sets - 10 reps - Quad Setting and Stretching  - 10 x daily - 7 x weekly - 3 sets - 10 reps - 5-10 hold - Supine Heel Slide with Strap  - 2 x daily - 7 x weekly - 3 sets - 10 reps - 5-10 hold - Sit to Stand Without Arm Support  - 2 x daily - 7 x weekly - 3 sets - 10 reps - Seated Heel Slide  - 2 x daily - 7  x weekly - 3 sets - 10 reps - 5-10 hold   - Long Sitting 4 Way Patellar Glide  - 1 x daily - 7 x weekly - 3 sets - 10 reps   Exercises - Prone Knee Extension Hang  - 2 x daily - 7 x weekly - 5 min hold - Prone Knee Extension with Ankle Weight  - 2 x daily - 7 x weekly - 5 min hold  ASSESSMENT:  CLINICAL IMPRESSION: Patient arrives to session with increased pain this date. Began session on recumbent bike for mobility. Followed with manual therapy techniques including soft tissue massage, and patella mobilizations. Pt reports improved pain following this. AROM this date is -3 to 105. Followed with contract relax technique to improve flexion and passive extension stretching with weight. Re-measured AROM as -2 to 108. Patient continues to require skilled physical therapy to address her remaining impairments to return to her prior level of function.       Eval: Patient is a 64 y.o. male who was seen today for physical therapy evaluation and treatment for  Z96.651 (ICD-10-CM) - Presence of right artificial knee joint  . On this date, patient demonstrates Impaired self perception of function, decreased ROM in R knee, decreased strength in RLE, impaired balance, and increased pain all of which may be contributing to patient's altered gait, difficulty with functional transfers, increased fall risk, difficulty with completing ADLs, and decreased endurance/activity tolerance. Educated on signs of DVT, appropriate resting positions for knee (can prop knee up as long as ankle is higher than knee to prevent risk of contractures), and importance of compression, and elevation. PT and wife report understanding of all. Patient will benefit from continued skilled physical therapy in order to address the above deficits and improve overall function.   OBJECTIVE IMPAIRMENTS: Abnormal gait, decreased activity tolerance, decreased balance, decreased endurance, decreased mobility, difficulty walking, decreased ROM, decreased strength, increased edema, impaired perceived functional ability, impaired flexibility, improper body mechanics, postural dysfunction, and pain.   ACTIVITY LIMITATIONS: carrying, lifting, bending, sitting, standing, squatting, stairs, transfers, bed mobility, bathing, and dressing  PARTICIPATION LIMITATIONS: meal prep, cleaning, laundry, driving, community activity, occupation, and yard work  PERSONAL FACTORS: N/A are also affecting patient's functional outcome.   REHAB POTENTIAL: Good  CLINICAL DECISION MAKING: Stable/uncomplicated  EVALUATION COMPLEXITY: Low   GOALS: Goals reviewed with patient? No  SHORT TERM GOALS: Target date: 02/23/23 Patient will be independent with performance of HEP to demonstrate adequate self management of symptoms.  Baseline: Reports compliance to HEP everyday Goal status: MET  2.   Patient will report  at least a 25% improvement with function and/or pain reduction overall since beginning PT. Baseline: Reports at least 50% improved on 02/19/24 Goal status: MET   LONG TERM GOALS: Target date: 03/23/23 Patient will improve LEFS score by 50 points  from original to demonstrate improved perceived function while meeting MCID.  Baseline: 12/29: 31/80 Goal status: Revised on 12/29 2.  Patient will improve  R knee flexion  ROM to at least 120 degrees to demonstrate improved LE mobility needed for functional transfers and gait mechanics.  Baseline:  Goal status: IN PROGRESS 3.  Patient will improve  R knee extension  ROM to at least 0 degrees to demonstrate improved LE mobility needed for functional transfers and gait mechanics.  Baseline:  Goal status: IN PROGRESS 4.  Patient will improve 5 times sit to stand test by at least 12 seconds to demonstrate increased LE strength and/or power needed  to improve functional transfers.  Baseline: see above Goal status: MET   5.  Patient will improve TUG score to 12 seconds or less with least restrictive assistive device to demonstrate improved fall risk.  Baseline: see above  Goal status: IN PROGRESS    PLAN:  PT FREQUENCY: 2x/week  PT DURATION: 8 weeks  PLANNED INTERVENTIONS: 97164- PT Re-evaluation, 97110-Therapeutic exercises, 97530- Therapeutic activity, [id number]- Neuromuscular re-education, 97535- Self Care, 02859- Manual therapy, [id number]- Gait training, [phone]- Electrical stimulation (manual), [id number]- Ultrasound, 02987- Traction (mechanical), [phone] (1-2 muscles), 20561 (3+ muscles)- Dry Needling, Patient/Family education, Balance training, Stair training, Taping, Joint mobilization, Spinal mobilization, Scar mobilization, Cryotherapy, and Moist heat  PLAN FOR NEXT SESSION: LE mobility and strength, balance interventions, manual as appropriate     3:36 PM, 02/26/2024 Mark Benecke Powell-Butler, PT, DPT [facility] [place] "

## 2024-02-29 ENCOUNTER — Encounter (HOSPITAL_COMMUNITY): Payer: Self-pay

## 2024-02-29 ENCOUNTER — Ambulatory Visit (HOSPITAL_COMMUNITY)

## 2024-02-29 DIAGNOSIS — M25661 Stiffness of right knee, not elsewhere classified: Secondary | ICD-10-CM

## 2024-02-29 DIAGNOSIS — R29898 Other symptoms and signs involving the musculoskeletal system: Secondary | ICD-10-CM

## 2024-02-29 DIAGNOSIS — Z7409 Other reduced mobility: Secondary | ICD-10-CM

## 2024-02-29 DIAGNOSIS — M25561 Pain in right knee: Secondary | ICD-10-CM

## 2024-02-29 NOTE — Therapy (Signed)
 " OUTPATIENT PHYSICAL THERAPY LOWER EXTREMITY TREATMENT   Patient Name: Alex Taylor MRN: [id number] DOB:[birth year], 64 y.o., male Today's Date: 02/29/2024  END OF SESSION:  PT End of Session - 02/29/24 1414     Visit Number 8    Number of Visits 16    Date for Recertification  03/18/24    Authorization Type Generic [employer]    Authorization Time Period No auth    Progress Note Due on Visit 16    PT Start Time 1415    PT Stop Time 1459    PT Time Calculation (min) 44 min    Activity Tolerance Patient tolerated treatment well    Behavior During Therapy WFL for tasks assessed/performed           Past Medical History:  Diagnosis Date   Arthritis    Diabetes mellitus without complication (HCC)    History of kidney stones    Hypertension    Past Surgical History:  Procedure Laterality Date   APPENDECTOMY     BACK SURGERY     COLONOSCOPY  12/20/2011   Procedure: COLONOSCOPY;  Surgeon: Alex DELENA Budge, MD;  Location: [facility];  Service: Gastroenterology;  Laterality: N/A;   FINGER SURGERY     KNEE ARTHROSCOPY     left knee   TOTAL KNEE ARTHROPLASTY Right 01/16/2024   Procedure: ARTHROPLASTY, KNEE, TOTAL;  Surgeon: Alex Chew, MD;  Location: [facility];  Service: Orthopedics;  Laterality: Right;   Patient Active Problem List   Diagnosis Date Noted   DM (diabetes mellitus) (HCC) 03/21/2012   Difficulty walking 10/28/2011   Open wound of hand except fingers 05/29/2008   Open wound of finger with tendon involvement 05/29/2008    PCP: Alex Longs, PA-C  REFERRING PROVIDER: Josefina Chew, MD  REFERRING DIAG:  [phone] (ICD-10-CM) - Presence of right artificial knee joint    THERAPY DIAG:  Decreased range of motion (ROM) of right knee  Impaired functional mobility, balance, gait, and endurance  Acute pain of right knee  Decreased strength of lower extremity  Rationale for Evaluation and Treatment: Rehabilitation  ONSET DATE: 01/16/24  SUBJECTIVE:    SUBJECTIVE STATEMENT: Pt reports pain around 4/10 today. Reports it as tightness. Reports he's usually sore 2 days after treatment sessions but it gets better.   Eval: Patient reports having surgery on 11/25. Reports he's tried some exercises at home that they gave him from hospital but pain and swelling restricting him some. Didn't get home therapy, and no CPM machine.    PERTINENT HISTORY: Alex Taylor is a  64 y.o. male with a diagnosis of right knee OA who failed conservative measures and elected for surgical management.  He actually had worse radiographic disease on the left knee than the right knee, but his right knee pain was severe and he had bone marrow edema on the medial and patellofemoral joint seen on MRI.  We discussed the possibility of injections, observation, more time, versus arthroscopic treatment, versus partial knee replacement versus total knee replacement, and the patient wished for total knee replacement. PAIN:  Are you having pain? Yes: NPRS scale: 6-7/10 Pain location: Entire R knee Pain description: Achy Aggravating factors: Walking, movement Relieving factors: Rest, medications some, Ice  PRECAUTIONS: None  RED FLAGS: None   WEIGHT BEARING RESTRICTIONS: No  FALLS:  Has patient fallen in last 6 months? 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  DIAGNOSTIC FINDINGS:  IMPRESSION: 1. Right total knee prosthesis in place without visible periprosthetic fracture or acute bony  findings. 2. Expected gas in the joint and soft tissues. 3. No complicating feature on this 2 view series.  PATIENT SURVEYS: LEFS  Extreme difficulty/unable (0), Quite a bit of difficulty (1), Moderate difficulty (2), Little difficulty (3), No difficulty (4) Survey date:    Any of your usual work, housework or school activities   2. Usual hobbies, recreational or sporting activities   3. Getting into/out of the bath   4. Walking between rooms   5. Putting on socks/shoes   6. Squatting    7. Lifting an object, like a bag of groceries from the floor   8. Performing light activities around your home   9. Performing heavy activities around your home   10. Getting into/out of a car   11. Walking 2 blocks   12. Walking 1 mile   13. Going up/down 10 stairs (1 flight)   14. Standing for 1 hour   15.  sitting for 1 hour   16. Running on even ground   17. Running on uneven ground   18. Making sharp turns while running fast   19. Hopping    20. Rolling over in bed   Score total:  Lower Extremity Functional Score: 5 / 80 = 6.3 %     02/19/24: Lower Extremity Functional Score: 31 / 80 = 38.8 %    COGNITION: Overall cognitive status: Within functional limits for tasks assessed     SENSATION: Light touch: WFL  EDEMA:  Mild-moderate edema in R knee joint and R gastroc muscle R  PALPATION: TTP on medial and lateral poles of R knee joint   LOWER EXTREMITY ROM:  Active ROM Right eval Left eval Right 02/19/24  Hip flexion     Hip extension     Hip abduction     Hip adduction     Hip internal rotation     Hip external rotation     Knee flexion 66 WFL 104  Knee extension -10 0 -4  Ankle dorsiflexion     Ankle plantarflexion     Ankle inversion     Ankle eversion      (Blank rows = not tested)  LOWER EXTREMITY MMT:  MMT Right eval Left eval  Hip flexion    Hip extension    Hip abduction    Hip adduction    Hip internal rotation    Hip external rotation    Knee  flexion    Knee extension    Ankle dorsiflexion    Ankle plantarflexion    Ankle inversion    Ankle eversion     (Blank rows = not tested)  LOWER EXTREMITY SPECIAL TESTS:    FUNCTIONAL TESTS:  5 times sit to stand: 42 seconds Timed up and go (TUG):  54 seconds w/ RW 2 minute walk test: 143 feet with RW on 01/25/24  02/19/24: TUG: 28 seconds, no AD 5 times sit to stand: 17 seconds, BUE use to push off 2  minute walk test: 264 ft with RW   GAIT: Distance walked: at least 100 ft in session Assistive device utilized: Walker - 2 wheeled Level of assistance: Modified independence Comments: Ambulates with RW, dec weight shift/stance time w/ RLE  -nearly TTWB, dec velocity                                                                                                                                TREATMENT DATE:  02/29/24: Knee Drives and ext w/ OP at // bars, 12 inch step, 10 holds, 15x Reverse lunges, 10x, at // bars BUE for support STS, holding tidal tank, 10x  Staggered stance with L foot on 4 inch step, 10x Side steps, 10 ft, 6x, CGA/supervision  Tandem ambulation, 10 ftx4, min/mod difficulty Recumbent bike, seat 8, half to full revolutions at end of trial, inc lateral lean to  6'  AROM: 106 flexion, -3 degrees   02/26/24: Recumbent bike, seat 9, half to full revolutions at end of trial, 5'  Manual therapy: soft tissue massage to quads distal to proximal for edema control, STM to medial joint line, and patella mobilizations superior/inferior and medial/lateral AROM: 105 flexion, -3 degrees Contract/Relax into max flexion, 3 holds, 2x Passive knee extension, foot on stool, 5', at 2.5 min added 10 lb AW at knee AROM re-measure: -2 to 108     02/19/24: Recumbent bike, seat 10, half revolutions, 5'  Progress note:  LEFS TUG 5 times sit to stand 2 minute walk test LE ROM: -4 from 0 extension, 104 flexion  Review of goals Gait trial with SPC, 225 ft, verbal cues for  form  LE Press: plate 3 - [id number], plate 4 - 7k89                       PATIENT EDUCATION:  Education details: goals for physical therapy, healing, x-ray results, anatomy, and expectations for soreness Person educated: Patient Education method: Explanation Education comprehension: verbalized understanding  HOME EXERCISE PROGRAM: Access Code: [email or web address] URL: [email or web address] Date: 02/05/2024 Prepared by: Rosaria Powell-Butler  Exercises - Supine Ankle Pumps  - 3 x daily - 7 x weekly - 3 sets - 10 reps - Quad Setting and Stretching  - 10 x daily - 7 x weekly - 3 sets - 10 reps - 5-10 hold - Supine Heel Slide with Strap  - 2 x daily - 7 x weekly - 3 sets - 10 reps - 5-10 hold - Sit to Stand Without Arm Support  - 2 x daily - 7 x weekly - 3 sets - 10 reps - Seated Heel Slide  - 2 x daily - 7 x weekly - 3 sets - 10 reps - 5-10 hold   - Long Sitting 4 Way Patellar Glide  - 1 x daily - 7 x weekly - 3 sets - 10 reps   Exercises - Prone Knee Extension Hang  - 2 x  daily - 7 x weekly - 5 min hold - Prone Knee Extension with Ankle Weight  - 2 x daily - 7 x weekly - 5 min hold  ASSESSMENT:  CLINICAL IMPRESSION: Patient is 6 weeks and 2 days post-op this date. Patient continues to demonstrate impaired and antalgic gait pattern using SPC. Began session with mobility activity for R knee ROM. Progressed therapeutic exercises to promote R quad and glute strengthening this date.  Added activities without AD during session as well as dynamic balance with pt demonstrating poor balance, utilizing shoulder abduction and step strategies to recover, CGA/min assist for safety. Pt reports inc fatigue and stiffness at end of session. AROM: 106 flexion and -3 degrees from full extension. Pt reports continued swelling in R knee. Encouraged use of compression garment during the day to address. Pt reports understanding. Patient continues to require skilled physical therapy to address her remaining  impairments to return to her prior level of function.       Eval: Patient is a 64 y.o. male who was seen today for physical therapy evaluation and treatment for  Z96.651 (ICD-10-CM) - Presence of right artificial knee joint  . On this date, patient demonstrates Impaired self perception of function, decreased ROM in R knee, decreased strength in RLE, impaired balance, and increased pain all of which may be contributing to patient's altered gait, difficulty with functional transfers, increased fall risk, difficulty with completing ADLs, and decreased endurance/activity tolerance. Educated on signs of DVT, appropriate resting positions for knee (can prop knee up as long as ankle is higher than knee to prevent risk of contractures), and importance of compression, and elevation. PT and wife report understanding of all. Patient will benefit from continued skilled physical therapy in order to address the above deficits and improve overall function.   OBJECTIVE IMPAIRMENTS: Abnormal gait, decreased activity tolerance, decreased balance, decreased endurance, decreased mobility, difficulty walking, decreased ROM, decreased strength, increased edema, impaired perceived functional ability, impaired flexibility, improper body mechanics, postural dysfunction, and pain.   ACTIVITY LIMITATIONS: carrying, lifting, bending, sitting, standing, squatting, stairs, transfers, bed mobility, bathing, and dressing  PARTICIPATION LIMITATIONS: meal prep, cleaning, laundry, driving, community activity, occupation, and yard work  PERSONAL FACTORS: N/A are also affecting patient's functional outcome.   REHAB POTENTIAL: Good  CLINICAL DECISION MAKING: Stable/uncomplicated  EVALUATION COMPLEXITY: Low   GOALS: Goals reviewed with patient? No  SHORT TERM GOALS: Target date: 02/23/23 Patient will be independent with performance of HEP to demonstrate adequate self management of symptoms.  Baseline: Reports compliance to HEP  everyday Goal status: MET  2.   Patient will report at least a 25% improvement with function and/or pain reduction overall since beginning PT. Baseline: Reports at least 50% improved on 02/19/24 Goal status: MET   LONG TERM GOALS: Target date: 03/23/23 Patient will improve LEFS score by 50 points  from original to demonstrate improved perceived function while meeting MCID.  Baseline: 12/29: 31/80 Goal status: Revised on 12/29 2.  Patient will improve  R knee flexion  ROM to at least 120 degrees to demonstrate improved LE mobility needed for functional transfers and gait mechanics.  Baseline:  Goal status: IN PROGRESS 3.  Patient will improve  R knee extension  ROM to at least 0 degrees to demonstrate improved LE mobility needed for functional transfers and gait mechanics.  Baseline:  Goal status: IN PROGRESS 4.  Patient will improve 5 times sit to stand test by at least 12 seconds to demonstrate increased LE strength  and/or power needed to improve functional transfers.  Baseline: see above Goal status: MET   5.  Patient will improve TUG score to 12 seconds or less with least restrictive assistive device to demonstrate improved fall risk.  Baseline: see above  Goal status: IN PROGRESS    PLAN:  PT FREQUENCY: 2x/week  PT DURATION: 8 weeks  PLANNED INTERVENTIONS: 97164- PT Re-evaluation, 97110-Therapeutic exercises, 97530- Therapeutic activity, [id number]- Neuromuscular re-education, 97535- Self Care, [id number]- Manual therapy, [id number]- Gait training, [phone]- Electrical stimulation (manual), [id number]- Ultrasound, 02987- Traction (mechanical), [phone] (1-2 muscles), 20561 (3+ muscles)- Dry Needling, Patient/Family education, Balance training, Stair training, Taping, Joint mobilization, Spinal mobilization, Scar mobilization, Cryotherapy, and Moist heat  PLAN FOR NEXT SESSION: LE mobility and strength, balance interventions, manual as appropriate     3:03 PM, 02/29/2024 Avrohom Mckelvin Powell-Butler, PT,  DPT [facility] [place] "

## 2024-03-01 ENCOUNTER — Other Ambulatory Visit (HOSPITAL_BASED_OUTPATIENT_CLINIC_OR_DEPARTMENT_OTHER): Payer: Self-pay

## 2024-03-01 MED ORDER — HYDROCODONE-ACETAMINOPHEN 5-325 MG PO TABS
1.0000 | ORAL_TABLET | Freq: Four times a day (QID) | ORAL | 0 refills | Status: AC | PRN
  Filled 2024-03-01: qty 30, 8d supply, fill #0

## 2024-03-04 ENCOUNTER — Ambulatory Visit (HOSPITAL_COMMUNITY)

## 2024-03-07 ENCOUNTER — Encounter (HOSPITAL_COMMUNITY): Payer: Self-pay

## 2024-03-07 ENCOUNTER — Ambulatory Visit (HOSPITAL_COMMUNITY)

## 2024-03-07 DIAGNOSIS — Z7409 Other reduced mobility: Secondary | ICD-10-CM

## 2024-03-07 DIAGNOSIS — M25661 Stiffness of right knee, not elsewhere classified: Secondary | ICD-10-CM

## 2024-03-07 DIAGNOSIS — M25561 Pain in right knee: Secondary | ICD-10-CM

## 2024-03-07 DIAGNOSIS — R29898 Other symptoms and signs involving the musculoskeletal system: Secondary | ICD-10-CM

## 2024-03-07 NOTE — Therapy (Signed)
 " OUTPATIENT PHYSICAL THERAPY LOWER EXTREMITY TREATMENT   Patient Name: Alex Taylor MRN: [id number] DOB:10-Aug-1960, 64 y.o., male Today's Date: 03/07/2024  END OF SESSION:  PT End of Session - 03/07/24 1419     Visit Number 9    Number of Visits 16    Date for Recertification  03/18/24    Authorization Type Generic [employer]    Authorization Time Period No auth    Progress Note Due on Visit 16    PT Start Time 1419    PT Stop Time 1500    PT Time Calculation (min) 41 min    Activity Tolerance Patient tolerated treatment well    Behavior During Therapy WFL for tasks assessed/performed           Past Medical History:  Diagnosis Date   Arthritis    Diabetes mellitus without complication (HCC)    History of kidney stones    Hypertension    Past Surgical History:  Procedure Laterality Date   APPENDECTOMY     BACK SURGERY     COLONOSCOPY  12/20/2011   Procedure: COLONOSCOPY;  Surgeon: Oneil DELENA Budge, MD;  Location: [facility];  Service: Gastroenterology;  Laterality: N/A;   FINGER SURGERY     KNEE ARTHROSCOPY     left knee   TOTAL KNEE ARTHROPLASTY Right 01/16/2024   Procedure: ARTHROPLASTY, KNEE, TOTAL;  Surgeon: Josefina Chew, MD;  Location: [facility];  Service: Orthopedics;  Laterality: Right;   Patient Active Problem List   Diagnosis Date Noted   DM (diabetes mellitus) (HCC) 03/21/2012   Difficulty walking 10/28/2011   Open wound of hand except fingers 05/29/2008   Open wound of finger with tendon involvement 05/29/2008    PCP: Dow Longs, PA-C  REFERRING PROVIDER: Josefina Chew, MD  REFERRING DIAG:  [phone] (ICD-10-CM) - Presence of right artificial knee joint    THERAPY DIAG:  Decreased range of motion (ROM) of right knee  Impaired functional mobility, balance, gait, and endurance  Acute pain of right knee  Decreased strength of lower extremity  Rationale for Evaluation and Treatment: Rehabilitation  ONSET DATE: 01/16/24  SUBJECTIVE:    SUBJECTIVE STATEMENT: Pt reports he stays at a consistent 4/10 for pain/stiffness. Reports he feels like swelling is stopping him most. Reports he did begin wearing compression garments again but its only day two of wearing them again.     Eval: Patient reports having surgery on 11/25. Reports he's tried some exercises at home that they gave him from hospital but pain and swelling restricting him some. Didn't get home therapy, and no CPM machine.    PERTINENT HISTORY: Alex Taylor is a  64 y.o. male with a diagnosis of right knee OA who failed conservative measures and elected for surgical management.  He actually had worse radiographic disease on the left knee than the right knee, but his right knee pain was severe and he had bone marrow edema on the medial and patellofemoral joint seen on MRI.  We discussed the possibility of injections, observation, more time, versus arthroscopic treatment, versus partial knee replacement versus total knee replacement, and the patient wished for total knee replacement. PAIN:  Are you having pain? Yes: NPRS scale: 6-7/10 Pain location: Entire R knee Pain description: Achy Aggravating factors: Walking, movement Relieving factors: Rest, medications some, Ice  PRECAUTIONS: None  RED FLAGS: None   WEIGHT BEARING RESTRICTIONS: No  FALLS:  Has patient fallen in last 6 months? 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  DIAGNOSTIC FINDINGS:  IMPRESSION: 1. Right total  knee prosthesis in place without visible periprosthetic fracture or acute bony findings. 2. Expected gas in the joint and soft tissues. 3. No complicating feature on this 2 view series.  PATIENT SURVEYS: LEFS  Extreme difficulty/unable (0), Quite a bit of difficulty (1), Moderate difficulty (2), Little difficulty (3), No difficulty (4) Survey date:    Any of your usual work, housework or school activities   2. Usual hobbies, recreational or sporting activities   3. Getting into/out of the bath   4. Walking between rooms   5. Putting on socks/shoes   6. Squatting    7. Lifting an object, like a bag of groceries from the floor   8. Performing light activities around your home   9. Performing heavy activities around your home   10. Getting into/out of a car   11. Walking 2 blocks   12. Walking 1 mile   13. Going up/down 10 stairs (1 flight)   14. Standing for 1 hour   15.  sitting for 1 hour   16. Running on even ground   17. Running on uneven ground   18. Making sharp turns while running fast   19. Hopping    20. Rolling over in bed   Score total:  Lower Extremity Functional Score: 5 / 80 = 6.3 %     02/19/24: Lower Extremity Functional Score: 31 / 80 = 38.8 %    COGNITION: Overall cognitive status: Within functional limits for tasks assessed     SENSATION: Light touch: WFL  EDEMA:  Mild-moderate edema in R knee joint and R gastroc muscle R  PALPATION: TTP on medial and lateral poles of R knee joint   LOWER EXTREMITY ROM:  Active ROM Right eval Left eval Right 02/19/24  Hip flexion     Hip extension     Hip abduction     Hip adduction     Hip internal rotation     Hip external rotation     Knee flexion 66 WFL 104  Knee extension -10 0 -4  Ankle dorsiflexion     Ankle plantarflexion     Ankle inversion     Ankle eversion      (Blank rows = not tested)  LOWER EXTREMITY MMT:  MMT Right eval Left eval  Hip flexion    Hip extension    Hip  abduction    Hip adduction    Hip internal rotation    Hip external rotation    Knee flexion    Knee extension    Ankle dorsiflexion    Ankle plantarflexion    Ankle inversion    Ankle eversion     (Blank rows = not tested)  LOWER EXTREMITY SPECIAL TESTS:    FUNCTIONAL TESTS:  5 times sit to stand: 42 seconds Timed up and go (TUG):  54 seconds w/ RW 2 minute walk test: 143 feet with RW on 01/25/24  02/19/24: TUG: 28 seconds,  no AD 5 times sit to stand: 17 seconds, BUE use to push off 2 minute walk test: 264 ft with RW   GAIT: Distance walked: at least 100 ft in session Assistive device utilized: Walker - 2 wheeled Level of assistance: Modified independence Comments: Ambulates with RW, dec weight shift/stance time w/ RLE  -nearly TTWB, dec velocity                                                                                                                                TREATMENT DATE:  03/07/24: Recumbent bike, seat 7, half to full revolutions at end of trial, inc lateral lean, improves with verbal cueing, 5' AROM measurement: -5-110 Manual: AP extension glides, 10 holds, 5x, pt semi reclined for comfort Prone lying, feet off EOB, 2' total, 1' with extra 3 lb. At ankles Seated passive knee ext stretch, foot on chair, 3' total, 1.5' with 3 lb. Weight on knee Seated hamstring stretch with self over pressure for knee ext, 3x30 Gastroc stretch, 3x30 Lateral step up with TKE against BTB, 4 inch step, 2x10    02/29/24: Knee Drives and ext w/ OP at // bars, 12 inch step, 10 holds, 15x Reverse lunges, 10x, at // bars BUE for support STS, holding tidal tank, 10x  Staggered stance with L foot on 4 inch step, 10x Side steps, 10 ft, 6x, CGA/supervision  Tandem ambulation, 10 ftx4, min/mod difficulty Recumbent bike, seat 8, half to full revolutions at end of trial, inc lateral lean to  6'  AROM: 106 flexion, -3 degrees   02/26/24: Recumbent bike, seat 9, half to full  revolutions at end of trial, 5'  Manual therapy: soft tissue massage to quads distal to proximal for edema control, STM to medial joint line, and patella mobilizations superior/inferior and medial/lateral AROM: 105 flexion, -3 degrees Contract/Relax into max flexion, 3 holds, 2x Passive knee extension, foot on stool, 5', at 2.5 min added 10 lb AW at knee AROM re-measure: -2 to 108                       PATIENT EDUCATION:  Education details: goals for physical therapy, healing, x-ray results, anatomy, and expectations for soreness Person educated: Patient Education method: Explanation Education comprehension: verbalized understanding  HOME EXERCISE PROGRAM: Access Code: [email or web address] URL: [email or web address] Date: 02/05/2024 Prepared by: Rosaria Powell-Butler  Exercises - Supine Ankle Pumps  - 3 x daily - 7 x weekly - 3 sets - 10 reps - Quad Setting and Stretching  - 10 x daily - 7 x weekly - 3 sets - 10 reps - 5-10 hold - Supine Heel Slide with Strap  - 2 x daily - 7 x weekly - 3 sets - 10 reps - 5-10 hold - Sit to Stand Without Arm Support  - 2 x daily - 7 x weekly - 3 sets - 10 reps - Seated Heel Slide  - 2 x  daily - 7 x weekly - 3 sets - 10 reps - 5-10 hold   - Long Sitting 4 Way Patellar Glide  - 1 x daily - 7 x weekly - 3 sets - 10 reps   Exercises - Prone Knee Extension Hang  - 2 x daily - 7 x weekly - 5 min hold - Prone Knee Extension with Ankle Weight  - 2 x daily - 7 x weekly - 5 min hold  Exercises - Seated Hamstring Stretch  - 2 x daily - 7 x weekly - 3 sets - 30 hold - Gastroc Stretch with Foot at Wall  - 2 x daily - 7 x weekly - 3 sets - 30 hold  ASSESSMENT:  CLINICAL IMPRESSION: Patient is 7 weeks and 2 days post-op. Began session on bike for ROM benefits. Pt is able to achieve more flexion this date with less compensating movements. Followed with AROM measurement; pt demonstrates continual progress with flexion mobility but remains limited with  extension. Addressed this throughout remainder of session with manual techniques and passive stretching. Pt with inc discomfort throughout but tolerates well. Added hamstring and gastrocnemius stretching to HEP. Patient continues to require skilled physical therapy to address her remaining impairments to return to her prior level of function.     Eval: Patient is a 64 y.o. male who was seen today for physical therapy evaluation and treatment for  Z96.651 (ICD-10-CM) - Presence of right artificial knee joint  . On this date, patient demonstrates Impaired self perception of function, decreased ROM in R knee, decreased strength in RLE, impaired balance, and increased pain all of which may be contributing to patient's altered gait, difficulty with functional transfers, increased fall risk, difficulty with completing ADLs, and decreased endurance/activity tolerance. Educated on signs of DVT, appropriate resting positions for knee (can prop knee up as long as ankle is higher than knee to prevent risk of contractures), and importance of compression, and elevation. PT and wife report understanding of all. Patient will benefit from continued skilled physical therapy in order to address the above deficits and improve overall function.   OBJECTIVE IMPAIRMENTS: Abnormal gait, decreased activity tolerance, decreased balance, decreased endurance, decreased mobility, difficulty walking, decreased ROM, decreased strength, increased edema, impaired perceived functional ability, impaired flexibility, improper body mechanics, postural dysfunction, and pain.   ACTIVITY LIMITATIONS: carrying, lifting, bending, sitting, standing, squatting, stairs, transfers, bed mobility, bathing, and dressing  PARTICIPATION LIMITATIONS: meal prep, cleaning, laundry, driving, community activity, occupation, and yard work  PERSONAL FACTORS: N/A are also affecting patient's functional outcome.   REHAB POTENTIAL: Good  CLINICAL DECISION  MAKING: Stable/uncomplicated  EVALUATION COMPLEXITY: Low   GOALS: Goals reviewed with patient? No  SHORT TERM GOALS: Target date: 02/23/24 Patient will be independent with performance of HEP to demonstrate adequate self management of symptoms.  Baseline: Reports compliance to HEP everyday Goal status: MET  2.   Patient will report at least a 25% improvement with function and/or pain reduction overall since beginning PT. Baseline: Reports at least 50% improved on 02/19/24 Goal status: MET   LONG TERM GOALS: Target date: 03/22/24 Patient will improve LEFS score by 50 points  from original to demonstrate improved perceived function while meeting MCID.  Baseline: 12/29: 31/80 Goal status: Revised on 12/29 2.  Patient will improve  R knee flexion  ROM to at least 120 degrees to demonstrate improved LE mobility needed for functional transfers and gait mechanics.  Baseline:  Goal status: IN PROGRESS 3.  Patient will  improve  R knee extension  ROM to at least 0 degrees to demonstrate improved LE mobility needed for functional transfers and gait mechanics.  Baseline:  Goal status: IN PROGRESS 4.  Patient will improve 5 times sit to stand test by at least 12 seconds to demonstrate increased LE strength and/or power needed to improve functional transfers.  Baseline: see above Goal status: MET   5.  Patient will improve TUG score to 12 seconds or less with least restrictive assistive device to demonstrate improved fall risk.  Baseline: see above  Goal status: IN PROGRESS    PLAN:  PT FREQUENCY: 2x/week  PT DURATION: 8 weeks  PLANNED INTERVENTIONS: 97164- PT Re-evaluation, 97110-Therapeutic exercises, 97530- Therapeutic activity, [id number]- Neuromuscular re-education, 97535- Self Care, [id number]- Manual therapy, [id number]- Gait training, [phone]- Electrical stimulation (manual), [id number]- Ultrasound, 02987- Traction (mechanical), [phone] (1-2 muscles), 20561 (3+ muscles)- Dry Needling, Patient/Family  education, Balance training, Stair training, Taping, Joint mobilization, Spinal mobilization, Scar mobilization, Cryotherapy, and Moist heat  PLAN FOR NEXT SESSION: LE mobility and strength, balance interventions, manual as appropriate     3:25 PM, 03/07/24 Faduma Cho Powell-Butler, PT, DPT [facility] [place] "

## 2024-03-11 ENCOUNTER — Ambulatory Visit (HOSPITAL_COMMUNITY)

## 2024-03-11 ENCOUNTER — Encounter (HOSPITAL_COMMUNITY): Payer: Self-pay

## 2024-03-11 DIAGNOSIS — M25561 Pain in right knee: Secondary | ICD-10-CM

## 2024-03-11 DIAGNOSIS — M25661 Stiffness of right knee, not elsewhere classified: Secondary | ICD-10-CM

## 2024-03-11 DIAGNOSIS — Z7409 Other reduced mobility: Secondary | ICD-10-CM

## 2024-03-11 NOTE — Therapy (Signed)
 " OUTPATIENT PHYSICAL THERAPY LOWER EXTREMITY TREATMENT   Patient Name: Alex Taylor MRN: [id number] DOB:Mar 18, 1960, 64 y.o., male Today's Date: 03/11/2024  END OF SESSION:  PT End of Session - 03/11/24 1423     Visit Number 10    Number of Visits 16    Date for Recertification  03/18/24    Authorization Type Generic [employer]    Authorization Time Period No auth    Progress Note Due on Visit 16    PT Start Time 1420    PT Stop Time 1459    PT Time Calculation (min) 39 min    Activity Tolerance Patient tolerated treatment well    Behavior During Therapy WFL for tasks assessed/performed            Past Medical History:  Diagnosis Date   Arthritis    Diabetes mellitus without complication (HCC)    History of kidney stones    Hypertension    Past Surgical History:  Procedure Laterality Date   APPENDECTOMY     BACK SURGERY     COLONOSCOPY  12/20/2011   Procedure: COLONOSCOPY;  Surgeon: Alex DELENA Budge, MD;  Location: [facility];  Service: Gastroenterology;  Laterality: N/A;   FINGER SURGERY     KNEE ARTHROSCOPY     left knee   TOTAL KNEE ARTHROPLASTY Right 01/16/2024   Procedure: ARTHROPLASTY, KNEE, TOTAL;  Surgeon: Alex Chew, MD;  Location: [facility];  Service: Orthopedics;  Laterality: Right;   Patient Active Problem List   Diagnosis Date Noted   DM (diabetes mellitus) (HCC) 03/21/2012   Difficulty walking 10/28/2011   Open wound of hand except fingers 05/29/2008   Open wound of finger with tendon involvement 05/29/2008    PCP: Alex Longs, PA-C  REFERRING PROVIDER: Josefina Chew, MD  REFERRING DIAG:  [phone] (ICD-10-CM) - Presence of right artificial knee joint    THERAPY DIAG:  Decreased range of motion (ROM) of right knee  Impaired functional mobility, balance, gait, and endurance  Acute Taylor of right knee  Rationale for Evaluation and Treatment: Rehabilitation  ONSET DATE: 01/16/24  SUBJECTIVE:   SUBJECTIVE STATEMENT: Pt reports 4/10 for  stiffness today. Reports night time is still the worst. Ice is working to help with Taylor. Reports he's taking Taylor medication usually just at night.    Eval: Patient reports having surgery on 11/25. Reports he's tried some exercises at home that they gave him from hospital but Taylor and swelling restricting him some. Didn't get home therapy, and no CPM machine.    PERTINENT HISTORY: Alex Taylor is a  64 y.o. male with a diagnosis of right knee OA who failed conservative measures and elected for surgical management.  He actually had worse radiographic disease on the left knee than the right knee, but his right knee Taylor was severe and he had bone marrow edema on the medial and patellofemoral joint seen on MRI.  We discussed the possibility of injections, observation, more time, versus arthroscopic treatment, versus partial knee replacement versus total knee replacement, and the patient wished for total knee replacement. Alex Taylor? Yes: NPRS scale: 6-7/10 Taylor location: Entire R knee Taylor description: Achy Aggravating factors: Walking, movement Relieving factors: Rest, medications some, Ice  PRECAUTIONS: None  RED FLAGS: None   WEIGHT BEARING RESTRICTIONS: No  FALLS:  Has patient fallen in last 6 months? 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  DIAGNOSTIC FINDINGS:  IMPRESSION: 1. Right total knee prosthesis in place without visible periprosthetic fracture or acute bony findings. 2.  Expected gas in the joint and soft tissues. 3. No complicating feature on this 2 view series.  PATIENT SURVEYS: LEFS  Extreme difficulty/unable (0), Quite a bit of difficulty (1), Moderate difficulty (2), Little difficulty (3), No difficulty (4) Survey date:    Any of your usual work, housework or school activities   2. Usual hobbies, recreational or sporting activities   3. Getting into/out of the bath   4. Walking between rooms   5. Putting on socks/shoes   6. Squatting    7. Lifting an object, like a bag of groceries from the floor   8. Performing light activities around your home   9. Performing heavy activities around your home   10. Getting into/out of a car   11. Walking 2 blocks   12. Walking 1 mile   13. Going up/down 10 stairs (1 flight)   14. Standing for 1 hour   15.  sitting for 1 hour   16. Running on even ground   17. Running on uneven ground   18. Making sharp turns while running fast   19. Hopping    20. Rolling over in bed   Score total:  Lower Extremity Functional Score: 5 / 80 = 6.3 %     02/19/24: Lower Extremity Functional Score: 31 / 80 = 38.8 %    COGNITION: Overall cognitive status: Within functional limits for tasks assessed     SENSATION: Light touch: WFL  EDEMA:  Mild-moderate edema in R knee joint and R gastroc muscle R  PALPATION: TTP on medial and lateral poles of R knee joint   LOWER EXTREMITY ROM:  Active ROM Right eval Left eval Right 02/19/24  Hip flexion     Hip extension     Hip abduction     Hip adduction     Hip internal rotation     Hip external rotation     Knee flexion 66 WFL 104  Knee extension -10 0 -4  Ankle dorsiflexion     Ankle plantarflexion     Ankle inversion     Ankle eversion      (Blank rows = not tested)  LOWER EXTREMITY MMT:  MMT Right eval Left eval  Hip flexion    Hip extension    Hip abduction    Hip adduction    Hip internal rotation    Hip external rotation    Knee flexion     Knee extension    Ankle dorsiflexion    Ankle plantarflexion    Ankle inversion    Ankle eversion     (Blank rows = not tested)  LOWER EXTREMITY SPECIAL TESTS:    FUNCTIONAL TESTS:  5 times sit to stand: 42 seconds Timed up and go (TUG):  54 seconds w/ RW 2 minute walk test: 143 feet with RW on 01/25/24  02/19/24: TUG: 28 seconds, no AD 5 times sit to stand: 17 seconds, BUE use to push  off 2 minute walk test: 264 ft with RW   GAIT: Distance walked: at least 100 ft in session Assistive device utilized: Walker - 2 wheeled Level of assistance: Modified independence Comments: Ambulates with RW, dec weight shift/stance time w/ RLE  -nearly TTWB, dec velocity                                                                                                                                TREATMENT DATE:  03/11/24: Recumbent bike, seat 7, full revolutions, inc lateral lean initially, improves over time, 5' Lateral heel step/tap, 2x10, at stairwell, BUE for support Hamstring curl machine, plate 5, 7k87 LE press machine, plate 5, 7k87 ROM: -2 extension AROM, 113 flexion (active assist from other LE)   03/07/24: Recumbent bike, seat 7, half to full revolutions at end of trial, inc lateral lean, improves with verbal cueing, 5' AROM measurement: -5-110 Manual: AP extension glides, 10 holds, 5x, pt semi reclined for comfort Prone lying, feet off EOB, 2' total, 1' with extra 3 lb. At ankles Seated passive knee ext stretch, foot on chair, 3' total, 1.5' with 3 lb. Weight on knee Seated hamstring stretch with self over pressure for knee ext, 3x30 Gastroc stretch, 3x30 Lateral step up with TKE against BTB, 4 inch step, 2x10    02/29/24: Knee Drives and ext w/ OP at // bars, 12 inch step, 10 holds, 15x Reverse lunges, 10x, at // bars BUE for support STS, holding tidal tank, 10x  Staggered stance with L foot on 4 inch step, 10x Side steps, 10 ft, 6x, CGA/supervision  Tandem  ambulation, 10 ftx4, min/mod difficulty Recumbent bike, seat 8, half to full revolutions at end of trial, inc lateral lean to  6'  AROM: 106 flexion, -3 degrees                         PATIENT EDUCATION:  Education details: goals for physical therapy, healing, x-ray results, anatomy, and expectations for soreness Person educated: Patient Education method: Explanation Education comprehension: verbalized understanding  HOME EXERCISE PROGRAM: Access Code: [email or web address] URL: [email or web address] Date: 02/05/2024 Prepared by: Rosaria Powell-Butler  Exercises - Supine Ankle Pumps  - 3 x daily - 7 x weekly - 3 sets - 10 reps - Quad Setting and Stretching  - 10 x daily - 7 x weekly - 3 sets - 10 reps - 5-10 hold - Supine Heel Slide with Strap  - 2 x daily - 7 x weekly - 3 sets - 10 reps - 5-10 hold - Sit to Stand Without Arm Support  - 2 x daily - 7 x weekly - 3 sets - 10 reps - Seated Heel Slide  - 2 x daily - 7 x weekly - 3 sets - 10 reps - 5-10 hold   - Long Sitting 4 Way Patellar Glide  - 1 x daily - 7 x weekly - 3 sets -  10 reps   Exercises - Prone Knee Extension Hang  - 2 x daily - 7 x weekly - 5 min hold - Prone Knee Extension with Ankle Weight  - 2 x daily - 7 x weekly - 5 min hold  Exercises - Seated Hamstring Stretch  - 2 x daily - 7 x weekly - 3 sets - 30 hold - Gastroc Stretch with Foot at Wall  - 2 x daily - 7 x weekly - 3 sets - 30 hold  ASSESSMENT:  CLINICAL IMPRESSION: Patient is 7 weeks and 6 days post-op this date. Reports mild-mod stiffness/Taylor. Began session on bike for ROM benefits. Remainder of session spent focused on R quad, and hamstring strengthening. Patient demonstrates improved ROM this date (see above). Still limited by Taylor and demonstrating altered gait due to this. Patient continues to require skilled physical therapy to address her remaining impairments to return to her prior level of function.      Eval: Patient is a 64 y.o. male who  was seen today for physical therapy evaluation and treatment for  Z96.651 (ICD-10-CM) - Presence of right artificial knee joint  . On this date, patient demonstrates Impaired self perception of function, decreased ROM in R knee, decreased strength in RLE, impaired balance, and increased Taylor all of which may be contributing to patient's altered gait, difficulty with functional transfers, increased fall risk, difficulty with completing ADLs, and decreased endurance/activity tolerance. Educated on signs of DVT, appropriate resting positions for knee (can prop knee up as long as ankle is higher than knee to prevent risk of contractures), and importance of compression, and elevation. PT and wife report understanding of all. Patient will benefit from continued skilled physical therapy in order to address the above deficits and improve overall function.   OBJECTIVE IMPAIRMENTS: Abnormal gait, decreased activity tolerance, decreased balance, decreased endurance, decreased mobility, difficulty walking, decreased ROM, decreased strength, increased edema, impaired perceived functional ability, impaired flexibility, improper body mechanics, postural dysfunction, and Taylor.   ACTIVITY LIMITATIONS: carrying, lifting, bending, sitting, standing, squatting, stairs, transfers, bed mobility, bathing, and dressing  PARTICIPATION LIMITATIONS: meal prep, cleaning, laundry, driving, community activity, occupation, and yard work  PERSONAL FACTORS: N/A are also affecting patient's functional outcome.   REHAB POTENTIAL: Good  CLINICAL DECISION MAKING: Stable/uncomplicated  EVALUATION COMPLEXITY: Low   GOALS: Goals reviewed with patient? No  SHORT TERM GOALS: Target date: 02/23/24 Patient will be independent with performance of HEP to demonstrate adequate self management of symptoms.  Baseline: Reports compliance to HEP everyday Goal status: MET  2.   Patient will report at least a 25% improvement with function  and/or Taylor reduction overall since beginning PT. Baseline: Reports at least 50% improved on 02/19/24 Goal status: MET   LONG TERM GOALS: Target date: 03/22/24 Patient will improve LEFS score by 50 points  from original to demonstrate improved perceived function while meeting MCID.  Baseline: 12/29: 31/80 Goal status: Revised on 12/29 2.  Patient will improve  R knee flexion  ROM to at least 120 degrees to demonstrate improved LE mobility needed for functional transfers and gait mechanics.  Baseline:  Goal status: IN PROGRESS 3.  Patient will improve  R knee extension  ROM to at least 0 degrees to demonstrate improved LE mobility needed for functional transfers and gait mechanics.  Baseline:  Goal status: IN PROGRESS 4.  Patient will improve 5 times sit to stand test by at least 12 seconds to demonstrate increased LE strength and/or power needed to  improve functional transfers.  Baseline: see above Goal status: MET   5.  Patient will improve TUG score to 12 seconds or less with least restrictive assistive device to demonstrate improved fall risk.  Baseline: see above  Goal status: IN PROGRESS    PLAN:  PT FREQUENCY: 2x/week  PT DURATION: 8 weeks  PLANNED INTERVENTIONS: 97164- PT Re-evaluation, 97110-Therapeutic exercises, 97530- Therapeutic activity, [id number]- Neuromuscular re-education, 97535- Self Care, 02859- Manual therapy, [id number]- Gait training, [phone]- Electrical stimulation (manual), [id number]- Ultrasound, 02987- Traction (mechanical), [phone] (1-2 muscles), [id number] (3+ muscles)- Dry Needling, Patient/Family education, Balance training, Stair training, Taping, Joint mobilization, Spinal mobilization, Scar mobilization, Cryotherapy, and Moist heat  PLAN FOR NEXT SESSION: LE mobility and strength, balance interventions, manual as appropriate     3:03 PM, 03/11/24 Hanya Guerin Powell-Butler, PT, DPT [facility] [place] "

## 2024-03-14 ENCOUNTER — Ambulatory Visit (HOSPITAL_COMMUNITY)

## 2024-03-18 ENCOUNTER — Ambulatory Visit (HOSPITAL_COMMUNITY)

## 2024-03-21 ENCOUNTER — Ambulatory Visit (HOSPITAL_COMMUNITY)

## 2024-03-25 ENCOUNTER — Other Ambulatory Visit (HOSPITAL_COMMUNITY): Payer: Self-pay

## 2024-03-29 ENCOUNTER — Other Ambulatory Visit (HOSPITAL_BASED_OUTPATIENT_CLINIC_OR_DEPARTMENT_OTHER): Payer: Self-pay

## 2024-03-29 ENCOUNTER — Other Ambulatory Visit (HOSPITAL_COMMUNITY): Payer: Self-pay

## 2024-03-29 MED ORDER — MELOXICAM 15 MG PO TABS
15.0000 mg | ORAL_TABLET | Freq: Every day | ORAL | 2 refills | Status: AC
  Filled 2024-03-29: qty 30, 30d supply, fill #0

## 2024-04-19 ENCOUNTER — Ambulatory Visit: Admitting: Nurse Practitioner
